# Patient Record
Sex: Female | Born: 1985 | Hispanic: Yes | Smoking: Never smoker
Health system: Southern US, Community
[De-identification: ages and names within clinical notes are randomized; demographics above are authoritative.]

## PROBLEM LIST (undated history)

## (undated) DIAGNOSIS — E119 Type 2 diabetes mellitus without complications: Secondary | ICD-10-CM

## (undated) DIAGNOSIS — Z789 Other specified health status: Secondary | ICD-10-CM

## (undated) HISTORY — DX: Other specified health status: Z78.9

---

## 2013-06-03 NOTE — L&D Delivery Note (Signed)
Attestation of Attending Supervision of Advanced Practitioner (CNM/NP): Evaluation and management procedures were performed by the Advanced Practitioner under my supervision and collaboration.  I have reviewed the Advanced Practitioner's note and chart, and I agree with the management and plan.  Mattix Imhof 03/06/2014 9:20 AM

## 2013-06-03 NOTE — L&D Delivery Note (Signed)
Delivery Note At 6:45 PM a viable and healthy female was delivered via  (Presentation: LOA).  APGAR: 8 & 9;  weight 7 lb 12 oz .   Placenta status: intact Anesthesia:  Epidural Episiotomy: None Lacerations: None Est. Blood Loss (mL): 400  Mom to postpartum.  Baby to Couplet care / Skin to Skin.  Rochele PagesKARIM, Cha Gomillion N 03/01/2014, 7:00 PM

## 2013-10-20 ENCOUNTER — Ambulatory Visit (INDEPENDENT_AMBULATORY_CARE_PROVIDER_SITE_OTHER): Payer: Self-pay | Admitting: *Deleted

## 2013-10-20 ENCOUNTER — Encounter: Payer: Self-pay | Admitting: *Deleted

## 2013-10-20 VITALS — BP 104/70 | HR 86 | Temp 97.4°F

## 2013-10-20 DIAGNOSIS — Z3201 Encounter for pregnancy test, result positive: Secondary | ICD-10-CM

## 2013-10-20 LAB — POCT PREGNANCY, URINE: PREG TEST UR: POSITIVE — AB

## 2013-10-21 LAB — OBSTETRIC PANEL
Antibody Screen: NEGATIVE
Basophils Absolute: 0 10*3/uL (ref 0.0–0.1)
Basophils Relative: 0 % (ref 0–1)
EOS ABS: 0.5 10*3/uL (ref 0.0–0.7)
Eosinophils Relative: 7 % — ABNORMAL HIGH (ref 0–5)
HCT: 34.5 % — ABNORMAL LOW (ref 36.0–46.0)
HEMOGLOBIN: 11.8 g/dL — AB (ref 12.0–15.0)
Hepatitis B Surface Ag: NEGATIVE
LYMPHS PCT: 25 % (ref 12–46)
Lymphs Abs: 1.8 10*3/uL (ref 0.7–4.0)
MCH: 28.9 pg (ref 26.0–34.0)
MCHC: 34.2 g/dL (ref 30.0–36.0)
MCV: 84.6 fL (ref 78.0–100.0)
Monocytes Absolute: 0.5 10*3/uL (ref 0.1–1.0)
Monocytes Relative: 7 % (ref 3–12)
NEUTROS PCT: 61 % (ref 43–77)
Neutro Abs: 4.5 10*3/uL (ref 1.7–7.7)
PLATELETS: 228 10*3/uL (ref 150–400)
RBC: 4.08 MIL/uL (ref 3.87–5.11)
RDW: 13.5 % (ref 11.5–15.5)
Rh Type: POSITIVE
Rubella: 1.35 Index — ABNORMAL HIGH (ref <0.90)
WBC: 7.3 10*3/uL (ref 4.0–10.5)

## 2013-10-21 LAB — HIV ANTIBODY (ROUTINE TESTING W REFLEX): HIV 1&2 Ab, 4th Generation: NONREACTIVE

## 2013-10-22 LAB — HEMOGLOBINOPATHY EVALUATION
HGB A: 97.2 % (ref 96.8–97.8)
HGB S QUANTITAION: 0 %
Hemoglobin Other: 0 %
Hgb A2 Quant: 2.8 % (ref 2.2–3.2)
Hgb F Quant: 0 % (ref 0.0–2.0)

## 2013-10-29 ENCOUNTER — Other Ambulatory Visit: Payer: Self-pay | Admitting: Obstetrics & Gynecology

## 2013-10-29 ENCOUNTER — Ambulatory Visit (HOSPITAL_COMMUNITY)
Admission: RE | Admit: 2013-10-29 | Discharge: 2013-10-29 | Disposition: A | Discharge status: Home / Self Care | Payer: Medicaid Other | Source: Ambulatory Visit | Attending: Obstetrics & Gynecology | Admitting: Obstetrics & Gynecology

## 2013-10-29 DIAGNOSIS — Z3201 Encounter for pregnancy test, result positive: Secondary | ICD-10-CM

## 2013-10-29 DIAGNOSIS — Z3689 Encounter for other specified antenatal screening: Secondary | ICD-10-CM | POA: Insufficient documentation

## 2013-11-18 ENCOUNTER — Encounter: Payer: Self-pay | Admitting: Family Medicine

## 2013-11-18 ENCOUNTER — Ambulatory Visit (INDEPENDENT_AMBULATORY_CARE_PROVIDER_SITE_OTHER): Payer: Self-pay | Admitting: Family Medicine

## 2013-11-18 VITALS — BP 111/79 | HR 105 | Temp 98.1°F | Ht 60.0 in | Wt 190.0 lb

## 2013-11-18 DIAGNOSIS — Z23 Encounter for immunization: Secondary | ICD-10-CM

## 2013-11-18 DIAGNOSIS — O34219 Maternal care for unspecified type scar from previous cesarean delivery: Secondary | ICD-10-CM

## 2013-11-18 DIAGNOSIS — Z3480 Encounter for supervision of other normal pregnancy, unspecified trimester: Secondary | ICD-10-CM

## 2013-11-18 DIAGNOSIS — Z98891 History of uterine scar from previous surgery: Secondary | ICD-10-CM | POA: Insufficient documentation

## 2013-11-18 LAB — POCT URINALYSIS DIP (DEVICE)
BILIRUBIN URINE: NEGATIVE
Glucose, UA: 100 mg/dL — AB
HGB URINE DIPSTICK: NEGATIVE
Ketones, ur: NEGATIVE mg/dL
Nitrite: NEGATIVE
Protein, ur: NEGATIVE mg/dL
Specific Gravity, Urine: 1.025 (ref 1.005–1.030)
UROBILINOGEN UA: 0.2 mg/dL (ref 0.0–1.0)
pH: 6 (ref 5.0–8.0)

## 2013-11-18 LAB — CBC
HEMATOCRIT: 35.1 % — AB (ref 36.0–46.0)
Hemoglobin: 11.9 g/dL — ABNORMAL LOW (ref 12.0–15.0)
MCH: 28.9 pg (ref 26.0–34.0)
MCHC: 33.9 g/dL (ref 30.0–36.0)
MCV: 85.2 fL (ref 78.0–100.0)
PLATELETS: 230 10*3/uL (ref 150–400)
RBC: 4.12 MIL/uL (ref 3.87–5.11)
RDW: 13.2 % (ref 11.5–15.5)
WBC: 7.7 10*3/uL (ref 4.0–10.5)

## 2013-11-18 MED ORDER — TETANUS-DIPHTH-ACELL PERTUSSIS 5-2.5-18.5 LF-MCG/0.5 IM SUSP
0.5000 mL | Freq: Once | INTRAMUSCULAR | Status: AC
  Administered 2013-11-18: 0.5 mL via INTRAMUSCULAR

## 2013-11-18 MED ORDER — FLUCONAZOLE 150 MG PO TABS: 150.0000 mg | ORAL_TABLET | Freq: Once | ORAL | Status: DC

## 2013-11-18 NOTE — Progress Notes (Signed)
   Subjective:    Regina [name] is a [id number] [id number] being seen today for her first obstetrical visit.  Her obstetrical history is significant for late to prenatal care. Pregnancy history fully reviewed.  Patient reports nausea, vaginal irritation, vomiting and vaginal discharge.  Filed Vitals:   11/18/13 0858 11/18/13 0903  BP: 111/79   Pulse: 105   Temp: 98.1 F (36.7 C)   Height:  5' (1.524 m)  Weight: 190 lb (86.183 kg)     HISTORY: OB History  Gravida Para Term Preterm AB SAB TAB Ectopic Multiple Living  3 2 2  0 0 0 0 0 0 2    # Outcome Date GA Lbr Len/2nd Weight Sex Delivery Anes PTL Lv  3 CUR           2 TRM 03/17/10 2117w0d  6 lb (2.722 kg) F VBAC     1 TRM 06/10/07 7517w0d  7 lb (3.175 kg) M CS        Comments: No complications - umbilical cord around the neck     Past Medical History  Diagnosis Date  . Medical history non-contributory    Past Surgical History  Procedure Laterality Date  . Cesarean section     Family History  Problem Relation Age of Onset  . Heart disease Mother   . Hypertension Mother   . Diabetes Mother   . Hyperlipidemia Mother      Exam    Uterus:   25 cm  Pelvic Exam:    Perineum: Normal Perineum   Vulva: Bartholin's, Urethra, Skene's normal   Vagina:  normal mucosa, normal discharge   Cervix: multiparous appearance   Adnexa: normal adnexa   Bony Pelvis: average  System: Breast:  normal appearance, no masses or tenderness   Skin: normal coloration and turgor, no rashes    Neurologic: normal   Extremities: normal strength, tone, and muscle mass   HEENT sclera clear, anicteric   Mouth/Teeth mucous membranes moist, pharynx normal without lesions   Neck supple   Cardiovascular: regular rate and rhythm, no murmurs or gallops   Respiratory:  appears well, vitals normal, no respiratory distress, acyanotic, normal RR, ear and throat exam is normal, neck free of mass or lymphadenopathy, chest clear, no wheezing,  crepitations, rhonchi, normal symmetric air entry   Abdomen: soft, non-tender; bowel sounds normal; no masses,  no organomegaly      Assessment:    Pregnancy: [id number] Patient Active Problem List   Diagnosis Date Noted  . Supervision of other normal pregnancy 11/18/2013        Plan:     Initial labs reviewed. Prenatal vitamins. Problem list reviewed and updated. Genetic Screening discussed Quad Screen: too late.  Ultrasound discussed; fetal survey: results reviewed.  Follow up in 2 weeks. 28 wk labs today and TDaP Pap and wet prep--appears to be years--rx sent to pharmacy  PRATT,TANYA S 11/18/2013

## 2013-11-18 NOTE — Patient Instructions (Addendum)
Tercer trimestre de embarazo (Third Trimester of Pregnancy) El tercer trimestre va desde la semana29 hasta la 42, desde el sptimo hasta el noveno mes, y es la poca en la que el feto crece ms rpidamente. Hacia el final del noveno mes, el feto mide alrededor de 20pulgadas (45cm) de largo y pesa entre 6 y 10 libras (2,700 y 4,500kg).  CAMBIOS EN EL ORGANISMO Su organismo atraviesa por muchos cambios durante el embarazo, y estos varan de una mujer a otra.   Seguir aumentando de peso. Es de esperar que aumente entre 25 y 35libras (11 y 16kg) hacia el final del embarazo.  Podrn aparecer las primeras estras en las caderas, el abdomen y las mamas.  Puede tener necesidad de orinar con ms frecuencia porque el feto baja hacia la pelvis y ejerce presin sobre la vejiga.  Debido al embarazo podr sentir acidez estomacal con frecuencia.  Puede estar estreida, ya que ciertas hormonas enlentecen los movimientos de los msculos que empujan los desechos a travs de los intestinos.  Pueden aparecer hemorroides o abultarse e hincharse las venas (venas varicosas).  Puede sentir dolor plvico debido al aumento de peso y a que las hormonas del embarazo relajan las articulaciones entre los huesos de la pelvis. El dolor de espalda puede ser consecuencia de la sobrecarga de los msculos que soportan la postura.  Tal vez haya cambios en el cabello que pueden incluir su engrosamiento, crecimiento rpido y cambios en la textura. Adems, a algunas mujeres se les cae el cabello durante o despus del embarazo, o tienen el cabello seco o fino. Lo ms probable es que el cabello se le normalice despus del nacimiento del beb.  Las mamas seguirn creciendo y le dolern. A veces, puede haber una secrecin amarilla de las mamas llamada calostro.  El ombligo puede salir hacia afuera.  Puede sentir que le falta el aire debido a que se expande el tero.  Puede notar que el feto "baja" o lo siente ms bajo, en  el abdomen.  Puede tener una prdida de secrecin mucosa con sangre. Esto suele ocurrir en el trmino de unos pocos das a una semana antes de que comience el trabajo de parto.  El cuello del tero se vuelve delgado y blando (se borra) cerca de la fecha de parto. QU DEBE ESPERAR EN LOS EXMENES PRENATALES  Le harn exmenes prenatales cada 2semanas hasta la semana36. A partir de ese momento le harn exmenes semanales. Durante una visita prenatal de rutina:  La pesarn para asegurarse de que usted y el feto estn creciendo normalmente.  Le tomarn la presin arterial.  Le medirn el abdomen para controlar el desarrollo del beb.  Se escucharn los latidos cardacos fetales.  Se evaluarn los resultados de los estudios solicitados en visitas anteriores.  Le revisarn el cuello del tero cuando est prxima la fecha de parto para controlar si este se ha borrado. Alrededor de la semana36, el mdico le revisar el cuello del tero. Al mismo tiempo, realizar un anlisis de las secreciones del tejido vaginal. Este examen es para determinar si hay un tipo de bacteria, estreptococo Grupo B. El mdico le explicar esto con ms detalle. El mdico puede preguntarle lo siguiente:  Cmo le gustara que fuera el parto.  Cmo se siente.  Si siente los movimientos del beb.  Si ha tenido sntomas anormales, como prdida de lquido, sangrado, dolores de cabeza intensos o clicos abdominales.  Si tiene alguna pregunta. Otros exmenes o estudios de deteccin que pueden realizarse   durante el tercer trimestre incluyen lo siguiente:  Anlisis de sangre para controlar las concentraciones de hierro (anemia).  Controles fetales para determinar su salud, nivel de actividad y crecimiento. Si tiene alguna enfermedad o hay problemas durante el embarazo, le harn estudios. FALSO TRABAJO DE PARTO Es posible que sienta contracciones leves e irregulares que finalmente desaparecen. Se llaman contracciones de  Braxton Hicks o falso trabajo de parto. Las contracciones pueden durar horas, das o incluso semanas, antes de que el verdadero trabajo de parto se inicie. Si las contracciones ocurren a intervalos regulares, se intensifican o se hacen dolorosas, lo mejor es que la revise el mdico.  SIGNOS DE TRABAJO DE PARTO   Clicos de tipo menstrual.  Contracciones cada 5minutos o menos.  Contracciones que comienzan en la parte superior del tero y se extienden hacia abajo, a la zona inferior del abdomen y la espalda.  Sensacin de mayor presin en la pelvis o dolor de espalda.  Una secrecin de mucosidad acuosa o con sangre que sale de la vagina. Si tiene alguno de estos signos antes de la semana37 del embarazo, llame a su mdico de inmediato. Debe concurrir al hospital para que la controlen inmediatamente. INSTRUCCIONES PARA EL CUIDADO EN EL HOGAR   Evite fumar, consumir hierbas, beber alcohol y tomar frmacos que no le hayan recetado. Estas sustancias qumicas afectan la formacin y el desarrollo del beb.  Siga las indicaciones del mdico en relacin con el uso de medicamentos. Durante el embarazo, hay medicamentos que son seguros de tomar y otros que no.  Haga actividad fsica solo en la forma indicada por el mdico. Sentir clicos uterinos es un buen signo para detener la actividad fsica.  Contine comiendo alimentos que sanos con regularidad.  Use un sostn que le brinde buen soporte si le duelen las mamas.  No se d baos de inmersin en agua caliente, baos turcos ni saunas.  Colquese el cinturn de seguridad cuando conduzca.  No coma carne cruda ni queso sin cocinar; evite el contacto con las bandejas sanitarias de los gatos y la tierra que estos animales usan. Estos elementos contienen grmenes que pueden causar defectos congnitos en el beb.  Tome las vitaminas prenatales.  Si est estreida, pruebe un laxante suave (si el mdico lo autoriza). Consuma ms alimentos ricos en  fibra, como vegetales y frutas frescos y cereales integrales. Beba gran cantidad de lquido para mantener la orina de tono claro o color amarillo plido.  Dese baos de asiento con agua tibia para aliviar el dolor o las molestias causadas por las hemorroides. Use una crema para las hemorroides si el mdico la autoriza.  Si tiene venas varicosas, use medias de descanso. Eleve los pies durante 15minutos, 3 o 4veces por da. Limite la cantidad de sal en su dieta.  Evite levantar objetos pesados, use zapatos de tacones bajos y mantenga una buena postura.  Descanse con las piernas elevadas si tiene calambres o dolor de cintura.  Visite a su dentista si no lo ha hecho durante el embarazo. Use un cepillo de dientes blando para higienizarse los dientes y psese el hilo dental con suavidad.  Puede seguir manteniendo relaciones sexuales, a menos que el mdico le indique lo contrario.  No haga viajes largos excepto que sea absolutamente necesario y solo con la autorizacin del mdico.  Tome clases prenatales para entender, practicar y hacer preguntas sobre el trabajo de parto y el parto.  Haga un ensayo de la partida al hospital.  Prepare el bolso que   llevar al hospital.  Prepare la habitacin del beb.  Concurra a todas las visitas prenatales segn las indicaciones de su mdico. SOLICITE ATENCIN MDICA SI:  No est segura de que est en trabajo de parto o de que ha roto la bolsa de las aguas.  Tiene mareos.  Siente clicos leves, presin en la pelvis o dolor persistente en el abdomen.  Tiene nuseas, vmitos o diarrea persistentes.  Tiene secrecin vaginal con mal olor.  Siente dolor al [employer]. SOLICITE ATENCIN MDICA DE INMEDIATO SI:   Tiene fiebre.  Tiene una prdida de lquido por la vagina.  Tiene sangrado o pequeas prdidas vaginales.  Siente dolor intenso o clicos en el abdomen.  Sube o baja de peso rpidamente.  Tiene dificultad para respirar y siente dolor de  pecho.  Sbitamente se le hinchan mucho el rostro, las [place], los tobillos, los pies o las piernas.  No ha sentido los movimientos del beb durante [place].  Siente un dolor de cabeza intenso que no se alivia con medicamentos.  Hay cambios en la visin. Document Released: 02/27/2005 Document Revised: 05/25/2013 [facility] Patient Information 2015 [place]. This information is not intended to replace advice given to you by your health care provider. Make sure you discuss any questions you have with your health care provider.  Lactancia materna (Breastfeeding) Decidir Economist es una de las mejores elecciones que puede hacer por usted y su beb. El cambio hormonal durante el Media planner produce el desarrollo del tejido mamario y [place] la cantidad y el tamao de los conductos galactforos. Estas hormonas tambin permiten que las protenas, los azcares y las grasas de la sangre produzcan la [employer] materna en las glndulas productoras de [place]. Las hormonas impiden que la leche materna sea liberada antes del nacimiento del beb, adems de impulsar el flujo de leche luego del nacimiento. Una vez que ha comenzado a Economist, Freight forwarder beb, as Therapist, occupational succin o Social research officer, government, pueden estimular la liberacin de [place] de las glndulas productoras de [place].  LOS BENEFICIOS DE AMAMANTAR Para el beb  La primera leche (calostro) ayuda a Garment/textile technologist funcionamiento del sistema digestivo del beb.  La leche tiene anticuerpos que ayudan a Chemical engineer las infecciones en el beb.  El beb tiene una menor incidencia de asma, alergias y del sndrome de muerte sbita del lactante.  Los nutrientes en la [place] materna son mejores para el beb que la [place] maternizada y estn preparados exclusivamente para cubrir las necesidades del beb.  La leche materna mejora el desarrollo cerebral del beb.  Es menos probable que el beb desarrolle otras enfermedades, como obesidad infantil, asma o diabetes mellitus de  tipo 2. Para usted   La lactancia materna favorece el desarrollo de un vnculo muy especial entre la madre y el beb.  Es conveniente. La leche materna siempre est disponible a la Tree surgeon y es [place].  La lactancia materna ayuda a quemar caloras y a perder el peso ganado durante el [place].  Favorece la contraccin del tero al tamao que tena antes del embarazo de manera ms rpida y disminuye el sangrado (loquios) despus del parto.  La lactancia materna contribuye a reducir Catering manager de desarrollar diabetes mellitus de tipo 2, osteoporosis o cncer de mama o de ovario en el futuro. SIGNOS DE QUE EL BEB EST HAMBRIENTO Primeros signos de hambre  Aumenta su estado de [place].  Se estira.  Mueve la cabeza de un lado a otro.  Mueve la cabeza y  abre la boca cuando se le toca la mejilla o la comisura de la boca (reflejo de bsqueda).  Aumenta las vocalizaciones, tales como sonidos de succin, se relame los labios, emite arrullos, suspiros, o chirridos.  Mueve la mano hacia la boca.  Se chupa con ganas los dedos o las manos. Signos tardos de hambre  Est agitado.  Llora de manera intermitente. Signos de hambre extrema Los signos de hambre extrema requerirn que lo calme y lo consuele antes de que el beb pueda alimentarse adecuadamente. No espere a que se manifiesten los siguientes signos de hambre extrema para comenzar a amamantar:   Agitacin.  Llanto intenso y fuerte.   Gritos. INFORMACIN BSICA SOBRE LA LACTANCIA MATERNA Iniciacin de la lactancia materna  Encuentre un lugar cmodo para sentarse o acostarse, con un buen respaldo para el cuello y la espalda.  Coloque una almohada o una manta enrollada debajo del beb para acomodarlo a la altura de la mama (si est sentada). Las almohadas para amamantar se han diseado especialmente a fin de servir de apoyo para los brazos y el beb mientras amamanta.  Asegrese de que el abdomen del  beb est frente al suyo.  Masajee suavemente la mama. Con las yemas de los dedos, masajee la pared del pecho hacia el pezn en un movimiento circular. Esto estimula el flujo de leche. Es posible que deba continuar este movimiento mientras amamanta si la leche fluye lentamente.  Sostenga la mama con el pulgar por arriba del pezn y los otros 4 dedos por debajo de la mama. Asegrese de que los dedos se encuentren lejos del pezn y de la boca del beb.  Empuje suavemente los labios del beb con el pezn o con el dedo.  Cuando la boca del beb se abra lo suficiente, acrquelo rpidamente a la mama e introduzca todo el pezn y la zona oscura que lo rodea (areola), tanto como sea posible, dentro de la boca del beb.  Debe haber ms areola visible por arriba del labio superior del beb que por debajo del labio inferior.  La lengua del beb debe estar entre la enca inferior y la mama.  Asegrese de que la boca del beb est en la posicin correcta alrededor del pezn (prendida). Los labios del beb deben crear un sello sobre la mama y estar doblados hacia afuera (invertidos).  Es comn que el beb succione durante 2 a 3 minutos para que comience el flujo de leche materna. Cmo debe prenderse Es muy importante que le ensee al beb cmo prenderse adecuadamente a la mama. Si el beb no se prende adecuadamente, puede causarle dolor en el pezn y reducir la produccin de leche materna, y hacer que el beb tenga un escaso aumento de peso. Adems, si el beb no se prende adecuadamente al pezn, puede tragar aire durante la alimentacin. Esto puede causarle molestias al beb. Hacer eructar al beb al cambiar de mama puede ayudarlo a liberar el aire. Sin embargo, ensearle al beb cmo prenderse a la mama adecuadamente es la mejor manera de evitar que se sienta molesto por tragar aire mientras se alimenta. Signos de que el beb se ha prendido adecuadamente al pezn:   Tironea o succiona de modo silencioso,  sin causarle dolor.  Se escucha que traga cada 3 o 4 succiones.   Hay movimientos musculares por arriba y por delante de sus odos al succionar. Signos de que el beb no se ha prendido adecuadamente al pezn:   Hace ruidos de succin o   de chasquido mientras se alimenta.  Siente dolor en el pezn. Si cree que el beb no se prendi correctamente, deslice el dedo en la comisura de la boca y colquelo entre las encas del beb para interrumpir la succin. Intente comenzar a amamantar nuevamente. Signos de lactancia materna exitosa Signos del beb:   Disminuye gradualmente el nmero de succiones o cesa la succin por completo.  Se duerme.  Relaja el cuerpo.  Retiene una pequea cantidad de leche en la boca.  Se desprende solo del pecho. Signos que presenta usted:  Las mamas han aumentado la firmeza, el peso y el tamao 1 a 3 horas despus de amamantar.  Estn ms blandas inmediatamente despus de amamantar.  Un aumento del volumen de leche, y tambin un cambio en su consistencia y color se producen hacia el quinto da de lactancia materna.  Los pezones no duelen, ni estn agrietados ni sangran. Signos de que su beb recibe la cantidad de leche suficiente  Moja al menos 3 paales en 24 horas. La orina debe ser clara y de color amarillo plido a los 5 das de vida.  Defeca al menos 3 veces en 24 horas a los 5 das de vida. La materia fecal debe ser blanda y amarillenta.  Defeca al menos 3 veces en 24 horas a los 7 das de vida. La materia fecal debe ser grumosa y amarillenta.  No registra una prdida de peso mayor del 10% del peso al nacer durante los primeros 3 das de vida.  Aumenta de peso un promedio de 4 a 7onzas (113 a 198g) por semana despus de los 4 das de vida.  Aumenta de peso, diariamente, de manera uniforme a partir de los 5 das de vida, sin registrar prdida de peso despus de las 2semanas de vida. Despus de alimentarse, es posible que el beb regurgite una  pequea cantidad. Esto es frecuente. FRECUENCIA Y DURACIN DE LA LACTANCIA MATERNA El amamantamiento frecuente la ayudar a producir ms leche y a prevenir problemas de dolor en los pezones e hinchazn en las mamas. Alimente al beb cuando muestre signos de hambre o si siente la necesidad de reducir la congestin de las mamas. Esto se denomina "lactancia a demanda". Evite el uso del chupete mientras trabaja para establecer la lactancia (las primeras 4 a 6 semanas despus del nacimiento del beb). Despus de este perodo, podr ofrecerle un chupete. Las investigaciones demostraron que el uso del chupete durante el primer ao de vida del beb disminuye el riesgo de desarrollar el sndrome de muerte sbita del lactante (SMSL). Permita que el nio se alimente en cada mama todo lo que desee. Contine amamantando al beb hasta que haya terminado de alimentarse. Cuando el beb se desprende o se queda dormido mientras se est alimentando de la primera mama, ofrzcale la segunda. Debido a que, con frecuencia, los recin nacidos permanecen somnolientos las primeras semanas de vida, es posible que deba despertar al beb para alimentarlo. Los horarios de lactancia varan de un beb a otro. Sin embargo, las siguientes reglas pueden servir como gua para ayudarla a garantizar que el beb se alimenta adecuadamente:  Se puede amamantar a los recin nacidos (bebs de 4 semanas o menos de vida) cada 1 a 3 horas.  No deben transcurrir ms de 3 horas durante el da o 5 horas durante la noche sin que se amamante a los recin nacidos.  Debe amamantar al beb 8 veces como mnimo en un perodo de 24 horas, hasta que comience a   introducir slidos en su dieta, a los 6 meses de vida aproximadamente. EXTRACCIN DE LECHE MATERNA La extraccin y el almacenamiento de la leche materna le permiten asegurarse de que el beb se alimente exclusivamente de leche materna, aun en momentos en los que no puede amamantar. Esto tiene especial  importancia si debe regresar al trabajo en el perodo en que an est amamantando o si no puede estar presente en los momentos en que el beb debe alimentarse. Su asesor en lactancia puede orientarla sobre cunto tiempo es seguro almacenar leche materna.  El sacaleche es un aparato que le permite extraer leche de la mama a un recipiente estril. Luego, la leche materna extrada puede almacenarse en un refrigerador o congelador. Algunos sacaleches son manuales, mientras que otros son elctricos. Consulte a su asesor en lactancia qu tipo ser ms conveniente para usted. Los sacaleches se pueden comprar; sin embargo, algunos hospitales y grupos de apoyo a la lactancia materna alquilan sacaleches mensualmente. Un asesor en lactancia puede ensearle cmo extraer leche materna manualmente, en caso de que prefiera no usar un sacaleche.  CMO CUIDAR LAS MAMAS DURANTE LA LACTANCIA MATERNA Los pezones se secan, agrietan y duelen durante la lactancia materna. Las siguientes recomendaciones pueden ayudarla a mantener las mamas humectadas y sanas:  Evite usar jabn en los pezones.  Use un sostn de soporte. Aunque no son esenciales, las camisetas sin mangas o los sostenes especiales para amamantar estn diseados para acceder fcilmente a las mamas, para amamantar sin tener que quitarse todo el sostn o la camiseta. Evite usar sostenes con aro o sostenes muy ajustados.  Seque al aire sus pezones durante 3 a 4minutos despus de amamantar al beb.  Utilice solo apsitos de algodn en el sostn para absorber las prdidas de leche. La prdida de un poco de leche materna entre las tomas es normal.  Utilice lanolina sobre los pezones luego de amamantar. La lanolina ayuda a mantener la humedad normal de la piel. Si usa lanolina pura, no tiene que lavarse los pezones antes de volver a alimentar al beb. La lanolina pura no es txica para el beb. Adems, puede extraer manualmente algunas gotas de leche materna y masajear  suavemente esa leche sobre los pezones, para que la leche se seque al aire. Durante las primeras semanas despus de dar a luz, algunas mujeres pueden experimentar hinchazn en las mamas (congestin mamaria). La congestin puede hacer que sienta las mamas pesadas, calientes y sensibles al tacto. El pico de la congestin ocurre dentro de los 3 a 5 das despus del parto. Las siguientes recomendaciones pueden ayudarla a aliviar la congestin:  Vace por completo las mamas al amamantar o extraer leche. Puede aplicar calor hmedo en las mamas (en la ducha o con toallas hmedas para manos) antes de amamantar o extraer leche. Esto aumenta la circulacin y ayuda a que la leche fluya. Si el beb no vaca por completo las mamas cuando lo amamanta, extraiga la leche restante despus de que haya finalizado.  Use un sostn ajustado (para amamantar o comn) o una camiseta sin mangas durante 1 o 2 das para indicar al cuerpo que disminuya ligeramente la produccin de leche.  Aplique compresas de hielo sobre las mamas, a menos que le resulte demasiado incmodo.  Asegrese de que el beb est prendido y se encuentre en la posicin correcta mientras lo alimenta. Si la congestin persiste luego de 48 horas o despus de seguir estas recomendaciones, comunquese con su mdico o un asesor en lactancia. RECOMENDACIONES GENERALES   PARA EL CUIDADO DE LA SALUD DURANTE LA LACTANCIA MATERNA  Consuma alimentos saludables. Alterne comidas y colaciones, y coma 3 de cada una por da. Dado que lo que come Danaher Corporationafecta la leche materna, es posible que algunas comidas hagan que su beb se vuelva ms irritable de lo habitual. Evite comer este tipo de alimentos si percibe que afectan de manera negativa al beb.  Beba leche, jugos de fruta y agua para Patent examinersatisfacer su sed (aproximadamente 10 vasos al Futures traderda).  Descanse con frecuencia, reljese y tome sus vitaminas prenatales para evitar la fatiga, el estrs y la anemia.  Contine con los  autocontroles de la mama.  Evite masticar y fumar tabaco.  Evite el consumo de alcohol y drogas. Algunos medicamentos, que pueden ser perjudiciales para el beb, pueden pasar a travs de la Colgate Palmoliveleche materna. Es importante que consulte a su mdico antes de Medical sales representativetomar cualquier medicamento, incluidos todos los medicamentos recetados y de Ponderosa Pineventa libre, as como los suplementos vitamnicos y herbales. Puede quedar embarazada durante la lactancia. Si desea controlar la natalidad, consulte a su mdico cules son las opciones ms seguras para el beb. SOLICITE ATENCIN MDICA SI:   Usted siente que quiere dejar de Museum/gallery exhibitions officeramamantar o se siente frustrada con la lactancia.  Siente dolor en las mamas o en los pezones.  Sus pezones estn agrietados o Water quality scientistsangran.  Sus pechos estn irritados, sensibles o calientes.  Tiene un rea hinchada en cualquiera de las mamas.  Siente escalofros o fiebre.  Tiene nuseas o vmitos.  Presenta una secrecin de otro lquido distinto de la leche materna de los pezones.  Sus mamas no se llenan antes de Museum/gallery exhibitions officeramamantar al beb para el quinto da despus del Crystal Fallsparto.  Se siente triste y deprimida.  El beb est demasiado somnoliento como para comer bien.  El beb tiene problemas para dormir.  Moja menos de 3 paales en 24 horas.  Defeca menos de 3 veces en 24 horas.  La piel del beb o la parte blanca de los ojos se vuelven amarillentas.  El beb no ha aumentado de Bethel Springspeso a los [address] das de [place]ida. SOLICITE ATENCIN MDICA DE INMEDIATO SI:   El beb est muy cansado Retail buyer(letargo) y no se quiere despertar para comer.  Le sube la fiebre sin causa. Document Released: 05/20/2005 Document Revised: 05/25/2013 [facility] Patient Information 2015 [place] NeckExitCare, MarylandLLC. This information is not intended to replace advice given to you by your health care provider. Make sure you discuss any questions you have with your health care provider. Parto vaginal despus de Eustace Quailuna cesrea (Vaginal Birth After Cesarean  Delivery) Un parto vaginal despus de un parto por cesrea es dar a luz por la vagina despus de haber dado a luz por medio de una intervencin Barbadosquirrgica. En el pasado, si una mujer tena un beb por cesrea, todos los partos posteriores deban hacerse por cesrea. Esto ya no es as. Puede ser seguro para la mam intentar un parto vaginal luego de una cesrea.  Es importante que converse con su mdico desde comienzos del Psychiatristembarazo de modo que pueda Googlecomprender los riesgos, beneficios y opciones. Le dar tiempo para decidir qu es lo mejor en su caso particular. La decisin final de tener un parto vaginal o por cesrea debe tomarse en conjunto, entre usted y Film/video editorel mdico. ComptrollerCualquier cambio en su salud o la de su beb durante el embarazo puede ser motivo de un cambio de decisin respecto del parto vaginal.  LAS MUJERES QUE OPTAN POR EL PARTO VAGINAL, DEBEN CONSULTAR  AL MDICO PARA ASEGURARSE DE QUE:  La cesrea anterior se haya realizado con un corte (incisin) uterino transversal (no con una incisin vertical clsica).  El canal de parto es lo suficientemente grande como para que pase el [place].  No ha sido sometida a otras operaciones del tero.  Durante el trabajo de parto, le realizarn un monitoreo fetal Forensic scientistelectrnico, en todo momento.  Habr un quirfano disponible y listo en caso de necesitar una cesrea de emergencia.  Un mdico y personal de quirfano estarn disponibles en todo momento durante el [place] de parto, para realizar una cesrea en caso de ser necesario.  Habr un anestesista disponible en caso de necesitar una cesrea de emergencia.  La nursery est lista y cuenta con personal especializado y el equipo disponible para cuidar al beb en caso de emergencia. BENEFICIOS DEL PARTO VAGINAL:  Permanencia ms breve en el hospital.  Prevencin de los riesgos asociados con el parto por cesrea, por ejemplo:  Complicaciones quirrgicas, como apertura o hernia de la incisin.  Lesiones en  otros rganos.  Grant RutsFiebre. Esto puede ocurrir si aparece una infeccin despus de la ciruga. Tambin puede ocurrir como reaccin a los medicamentos administrados para adormecerla durante la [place]uga.  Menos prdida de sangre y menos probabilidad de necesitar una transfusin sangunea.  Menor riesgo de cogulos sanguneos e infeccin.  Tiempo ms corto de recuperacin.  Menor riesgo de remocin del tero (histerectoma).  Menor riesgo de que la placenta cubra parcial o completamente la abertura del tero (placenta previa) en embarazos futuros.  Menos riesgos en el [place] Citytrabajo de parto y [place]ael parto futuros. RIESGOS  Ruptura del tero. Esto ocurre en menos del 1% de los partos vaginales. El riesgo de que eso suceda es mayor si:  Se toman medidas para iniciar el proceso del [place] Heightstrabajo de parto (inducir Engineer, manufacturing systemsel parto) o Risk managerestimular o intensificar las contracciones (aumentar el trabajo de [place]parto).  Se usan medicamentos para ablandar (madurar) el cuello del tero.  Es necesario extraer el tero (histerectoma) si se rompe. No debe llevarse a cabo si:  La cesrea previa se realiz con una incisin vertical (clsica) o con forma de T, o usted no sabe cul de Lucent Technologiesellas le han practicado.  Ha sufrido ruptura del tero.  Ha tenido ciertos tipos de Leisure centre managerciruga en el tero, como la extirpacin de fibromas uterinos. Pregntele a su mdico sobre otros tipos de cirugas que le impiden tener un parto vaginal.  Tiene ciertos problemas mdicos o relacionados con el parto (obsttricos).  El beb est en problemas.  Tuvo dos cesreas previas y ningn parto vaginal. OTRAS COSAS QUE DEBE SABER:  La anestesia peridural es segura.  Es seguro dar vuelta al beb si se encuentra de nalgas (intentar una versin ceflica externa).  Es seguro intentarlo en caso de mellizos.  El parto vaginal puede no ser apropiado si el beb pesa 8,8lb (4kg) o ms. Sin embargo, las predicciones de Mooresboropeso no son siempre exactas y no deben ser lo  nico a tenerse en cuenta para decidir si el parto vaginal es lo indicado para usted.  Hay aumento en el porcentaje de fracasos si el intervalo entre la cesrea y el parto vaginal es de menos de 19 meses.  Su mdico puede aconsejarle no tener un parto vaginal si tiene preeclampsia (hipertensin, protena en la orina e hinchazn en la cara y las extremidades).  El parto vaginal suele ser exitoso si ya tuvo un parto vaginal previamente.  Tambin suele ser exitoso cuando el [place]abajo de [place]parto  comienza espontneamente antes de la fecha.  El parto vaginal despus de Eustace Quailuna cesrea es similar a un parto espontneo vaginal normal. Document Released: 11/06/2007 Document Revised: 03/10/2013 ExitCare Patient Information 2015 [employer] [place]. This information is not intended to replace advice given to you by your health care provider. Make sure you discuss any questions you have with your health care provider.

## 2013-11-18 NOTE — Progress Notes (Signed)
Reports watery discharge causing itching and irritation. Wet prep and pap today.  Reports occasional lower abdominal/pelvic pain/pressure.  Discussed appropriate weight gain based on BMI (11-20lb); patient verbalized understanding.  New OB packet given.  1hr gtt and labs today. Tdap today.

## 2013-11-19 ENCOUNTER — Encounter: Payer: Self-pay | Admitting: Family Medicine

## 2013-11-19 LAB — WET PREP, GENITAL
Clue Cells Wet Prep HPF POC: NONE SEEN
Trich, Wet Prep: NONE SEEN
WBC, Wet Prep HPF POC: NONE SEEN

## 2013-11-19 LAB — RPR

## 2013-11-19 LAB — GLUCOSE TOLERANCE, 1 HOUR (50G) W/O FASTING: GLUCOSE 1 HOUR GTT: 96 mg/dL (ref 70–140)

## 2013-11-19 LAB — HIV ANTIBODY (ROUTINE TESTING W REFLEX): HIV: NONREACTIVE

## 2013-11-20 LAB — PRESCRIPTION MONITORING PROFILE (19 PANEL)
Amphetamine/Meth: NEGATIVE ng/mL
Barbiturate Screen, Urine: NEGATIVE ng/mL
Benzodiazepine Screen, Urine: NEGATIVE ng/mL
Buprenorphine, Urine: NEGATIVE ng/mL
CANNABINOID SCRN UR: NEGATIVE ng/mL
COCAINE METABOLITES: NEGATIVE ng/mL
Carisoprodol, Urine: NEGATIVE ng/mL
Creatinine, Urine: 116.67 mg/dL (ref >20.0)
ECSTASY: NEGATIVE ng/mL
Fentanyl, Ur: NEGATIVE ng/mL
MEPERIDINE UR: NEGATIVE ng/mL
METHAQUALONE SCREEN (URINE): NEGATIVE ng/mL
Methadone Screen, Urine: NEGATIVE ng/mL
Nitrites, Initial: NEGATIVE ug/mL
Opiate Screen, Urine: NEGATIVE ng/mL
Oxycodone Screen, Ur: NEGATIVE ng/mL
PH URINE, INITIAL: 6.4 pH (ref 4.5–8.9)
PHENCYCLIDINE, UR: NEGATIVE ng/mL
Propoxyphene: NEGATIVE ng/mL
Tapentadol, urine: NEGATIVE ng/mL
Tramadol Scrn, Ur: NEGATIVE ng/mL
Zolpidem, Urine: NEGATIVE ng/mL

## 2013-11-20 LAB — CULTURE, OB URINE: Colony Count: 5000

## 2013-11-22 LAB — CYTOLOGY - PAP

## 2013-12-02 ENCOUNTER — Ambulatory Visit (INDEPENDENT_AMBULATORY_CARE_PROVIDER_SITE_OTHER): Payer: Self-pay | Admitting: Obstetrics and Gynecology

## 2013-12-02 ENCOUNTER — Encounter: Payer: Self-pay | Admitting: Obstetrics and Gynecology

## 2013-12-02 VITALS — BP 111/76 | HR 96 | Temp 97.1°F | Wt 194.0 lb

## 2013-12-02 DIAGNOSIS — Z3483 Encounter for supervision of other normal pregnancy, third trimester: Secondary | ICD-10-CM

## 2013-12-02 DIAGNOSIS — O34219 Maternal care for unspecified type scar from previous cesarean delivery: Secondary | ICD-10-CM

## 2013-12-02 DIAGNOSIS — Z3480 Encounter for supervision of other normal pregnancy, unspecified trimester: Secondary | ICD-10-CM

## 2013-12-02 LAB — POCT URINALYSIS DIP (DEVICE)
Bilirubin Urine: NEGATIVE
Glucose, UA: 500 mg/dL — AB
HGB URINE DIPSTICK: NEGATIVE
Ketones, ur: NEGATIVE mg/dL
NITRITE: NEGATIVE
PH: 6 (ref 5.0–8.0)
Protein, ur: NEGATIVE mg/dL
Specific Gravity, Urine: 1.025 (ref 1.005–1.030)
UROBILINOGEN UA: 0.2 mg/dL (ref 0.0–1.0)

## 2013-12-02 MED ORDER — NYSTATIN-TRIAMCINOLONE 100000-0.1 UNIT/GM-% EX OINT: 1.0000 "application " | TOPICAL_OINTMENT | Freq: Two times a day (BID) | CUTANEOUS | Status: DC

## 2013-12-02 NOTE — Progress Notes (Signed)
Patient is doing well. She reports a pruritic rash in groin area. Rash consistent with yeast infection-Mycolog ointment prescribed. FM/PTL precautions reviewed.

## 2013-12-02 NOTE — Progress Notes (Signed)
Edema-feet with pain in leg

## 2013-12-15 ENCOUNTER — Encounter: Payer: Self-pay | Admitting: *Deleted

## 2013-12-17 ENCOUNTER — Ambulatory Visit (INDEPENDENT_AMBULATORY_CARE_PROVIDER_SITE_OTHER): Payer: Self-pay | Admitting: Obstetrics and Gynecology

## 2013-12-17 ENCOUNTER — Encounter: Payer: Self-pay | Admitting: Obstetrics and Gynecology

## 2013-12-17 VITALS — BP 98/69 | HR 80 | Temp 97.1°F | Wt 195.1 lb

## 2013-12-17 DIAGNOSIS — O34219 Maternal care for unspecified type scar from previous cesarean delivery: Secondary | ICD-10-CM

## 2013-12-17 DIAGNOSIS — Z3483 Encounter for supervision of other normal pregnancy, third trimester: Secondary | ICD-10-CM

## 2013-12-17 DIAGNOSIS — Z3480 Encounter for supervision of other normal pregnancy, unspecified trimester: Secondary | ICD-10-CM

## 2013-12-17 LAB — POCT URINALYSIS DIP (DEVICE)
Bilirubin Urine: NEGATIVE
Glucose, UA: NEGATIVE mg/dL
Hgb urine dipstick: NEGATIVE
KETONES UR: NEGATIVE mg/dL
NITRITE: NEGATIVE
PH: 7 (ref 5.0–8.0)
PROTEIN: NEGATIVE mg/dL
Specific Gravity, Urine: 1.02 (ref 1.005–1.030)
Urobilinogen, UA: 0.2 mg/dL (ref 0.0–1.0)

## 2013-12-17 NOTE — Progress Notes (Signed)
Patient reports pressure with breathing sometimes and occasional stomach pain

## 2013-12-17 NOTE — Progress Notes (Signed)
Patient is doing well without complaints. FM/PTL precautions reviewed. Groin rash resolved with cream

## 2013-12-31 ENCOUNTER — Ambulatory Visit (INDEPENDENT_AMBULATORY_CARE_PROVIDER_SITE_OTHER): Payer: Self-pay | Admitting: Obstetrics & Gynecology

## 2013-12-31 VITALS — BP 108/71 | HR 75 | Wt 193.8 lb

## 2013-12-31 DIAGNOSIS — Z3480 Encounter for supervision of other normal pregnancy, unspecified trimester: Secondary | ICD-10-CM

## 2013-12-31 DIAGNOSIS — Z3483 Encounter for supervision of other normal pregnancy, third trimester: Secondary | ICD-10-CM

## 2013-12-31 LAB — POCT URINALYSIS DIP (DEVICE)
Bilirubin Urine: NEGATIVE
Glucose, UA: NEGATIVE mg/dL
Ketones, ur: NEGATIVE mg/dL
Nitrite: NEGATIVE
PROTEIN: 100 mg/dL — AB
SPECIFIC GRAVITY, URINE: 1.025 (ref 1.005–1.030)
UROBILINOGEN UA: 1 mg/dL (ref 0.0–1.0)
pH: 7 (ref 5.0–8.0)

## 2013-12-31 NOTE — Progress Notes (Signed)
States she is eating well and no nausea or heartburn.

## 2013-12-31 NOTE — Progress Notes (Signed)
Feels pain in her lower back and thighs.

## 2013-12-31 NOTE — Patient Instructions (Signed)
Tercer trimestre de embarazo (Third Trimester of Pregnancy) El tercer trimestre va desde la semana29 hasta la 42, desde el sptimo hasta el noveno mes, y es la poca en la que el feto crece ms rpidamente. Hacia el final del noveno mes, el feto mide alrededor de 20pulgadas (45cm) de largo y pesa entre 6 y 10 libras (2,700 y 4,500kg).  CAMBIOS EN EL ORGANISMO Su organismo atraviesa por muchos cambios durante el embarazo, y estos varan de una mujer a otra.   Seguir aumentando de peso. Es de esperar que aumente entre 25 y 35libras (11 y 16kg) hacia el final del embarazo.  Podrn aparecer las primeras estras en las caderas, el abdomen y las mamas.  Puede tener necesidad de orinar con ms frecuencia porque el feto baja hacia la pelvis y ejerce presin sobre la vejiga.  Debido al embarazo podr sentir acidez estomacal con frecuencia.  Puede estar estreida, ya que ciertas hormonas enlentecen los movimientos de los msculos que empujan los desechos a travs de los intestinos.  Pueden aparecer hemorroides o abultarse e hincharse las venas (venas varicosas).  Puede sentir dolor plvico debido al aumento de peso y a que las hormonas del embarazo relajan las articulaciones entre los huesos de la pelvis. El dolor de espalda puede ser consecuencia de la sobrecarga de los msculos que soportan la postura.  Tal vez haya cambios en el cabello que pueden incluir su engrosamiento, crecimiento rpido y cambios en la textura. Adems, a algunas mujeres se les cae el cabello durante o despus del embarazo, o tienen el cabello seco o fino. Lo ms probable es que el cabello se le normalice despus del nacimiento del beb.  Las mamas seguirn creciendo y le dolern. A veces, puede haber una secrecin amarilla de las mamas llamada calostro.  El ombligo puede salir hacia afuera.  Puede sentir que le falta el aire debido a que se expande el tero.  Puede notar que el feto "baja" o lo siente ms bajo, en el  abdomen.  Puede tener una prdida de secrecin mucosa con sangre. Esto suele ocurrir en el trmino de unos pocos das a una semana antes de que comience el trabajo de parto.  El cuello del tero se vuelve delgado y blando (se borra) cerca de la fecha de parto. QU DEBE ESPERAR EN LOS EXMENES PRENATALES  Le harn exmenes prenatales cada 2semanas hasta la semana36. A partir de ese momento le harn exmenes semanales. Durante una visita prenatal de rutina:  La pesarn para asegurarse de que usted y el feto estn creciendo normalmente.  Le tomarn la presin arterial.  Le medirn el abdomen para controlar el desarrollo del beb.  Se escucharn los latidos cardacos fetales.  Se evaluarn los resultados de los estudios solicitados en visitas anteriores.  Le revisarn el cuello del tero cuando est prxima la fecha de parto para controlar si este se ha borrado. Alrededor de la semana36, el mdico le revisar el cuello del tero. Al mismo tiempo, realizar un anlisis de las secreciones del tejido vaginal. Este examen es para determinar si hay un tipo de bacteria, estreptococo Grupo B. El mdico le explicar esto con ms detalle. El mdico puede preguntarle lo siguiente:  Cmo le gustara que fuera el parto.  Cmo se siente.  Si siente los movimientos del beb.  Si ha tenido sntomas anormales, como prdida de lquido, sangrado, dolores de cabeza intensos o clicos abdominales.  Si tiene alguna pregunta. Otros exmenes o estudios de deteccin que pueden realizarse   durante el tercer trimestre incluyen lo siguiente:  Anlisis de sangre para controlar las concentraciones de hierro (anemia).  Controles fetales para determinar su salud, nivel de actividad y crecimiento. Si tiene alguna enfermedad o hay problemas durante el embarazo, le harn estudios. FALSO TRABAJO DE PARTO Es posible que sienta contracciones leves e irregulares que finalmente desaparecen. Se llaman contracciones de  Braxton Hicks o falso trabajo de parto. Las contracciones pueden durar horas, das o incluso semanas, antes de que el verdadero trabajo de parto se inicie. Si las contracciones ocurren a intervalos regulares, se intensifican o se hacen dolorosas, lo mejor es que la revise el mdico.  SIGNOS DE TRABAJO DE PARTO   Clicos de tipo menstrual.  Contracciones cada 5minutos o menos.  Contracciones que comienzan en la parte superior del tero y se extienden hacia abajo, a la zona inferior del abdomen y la espalda.  Sensacin de mayor presin en la pelvis o dolor de espalda.  Una secrecin de mucosidad acuosa o con sangre que sale de la vagina. Si tiene alguno de estos signos antes de la semana37 del embarazo, llame a su mdico de inmediato. Debe concurrir al hospital para que la controlen inmediatamente. INSTRUCCIONES PARA EL CUIDADO EN EL HOGAR   Evite fumar, consumir hierbas, beber alcohol y tomar frmacos que no le hayan recetado. Estas sustancias qumicas afectan la formacin y el desarrollo del beb.  Siga las indicaciones del mdico en relacin con el uso de medicamentos. Durante el embarazo, hay medicamentos que son seguros de tomar y otros que no.  Haga actividad fsica solo en la forma indicada por el mdico. Sentir clicos uterinos es un buen signo para detener la actividad fsica.  Contine comiendo alimentos que sanos con regularidad.  Use un sostn que le brinde buen soporte si le duelen las mamas.  No se d baos de inmersin en agua caliente, baos turcos ni saunas.  Colquese el cinturn de seguridad cuando conduzca.  No coma carne cruda ni queso sin cocinar; evite el contacto con las bandejas sanitarias de los gatos y la tierra que estos animales usan. Estos elementos contienen grmenes que pueden causar defectos congnitos en el beb.  Tome las vitaminas prenatales.  Si est estreida, pruebe un laxante suave (si el mdico lo autoriza). Consuma ms alimentos ricos en  fibra, como vegetales y frutas frescos y cereales integrales. Beba gran cantidad de lquido para mantener la orina de tono claro o color amarillo plido.  Dese baos de asiento con agua tibia para aliviar el dolor o las molestias causadas por las hemorroides. Use una crema para las hemorroides si el mdico la autoriza.  Si tiene venas varicosas, use medias de descanso. Eleve los pies durante 15minutos, 3 o 4veces por da. Limite la cantidad de sal en su dieta.  Evite levantar objetos pesados, use zapatos de tacones bajos y mantenga una buena postura.  Descanse con las piernas elevadas si tiene calambres o dolor de cintura.  Visite a su dentista si no lo ha hecho durante el embarazo. Use un cepillo de dientes blando para higienizarse los dientes y psese el hilo dental con suavidad.  Puede seguir manteniendo relaciones sexuales, a menos que el mdico le indique lo contrario.  No haga viajes largos excepto que sea absolutamente necesario y solo con la autorizacin del mdico.  Tome clases prenatales para entender, practicar y hacer preguntas sobre el trabajo de parto y el parto.  Haga un ensayo de la partida al hospital.  Prepare el bolso que   llevar al hospital.  Prepare la habitacin del beb.  Concurra a todas las visitas prenatales segn las indicaciones de su mdico. SOLICITE ATENCIN MDICA SI:  No est segura de que est en trabajo de parto o de que ha roto la bolsa de las aguas.  Tiene mareos.  Siente clicos leves, presin en la pelvis o dolor persistente en el abdomen.  Tiene nuseas, vmitos o diarrea persistentes.  Tiene secrecin vaginal con mal olor.  Siente dolor al orinar. SOLICITE ATENCIN MDICA DE INMEDIATO SI:   Tiene fiebre.  Tiene una prdida de lquido por la vagina.  Tiene sangrado o pequeas prdidas vaginales.  Siente dolor intenso o clicos en el abdomen.  Sube o baja de peso rpidamente.  Tiene dificultad para respirar y siente dolor de  pecho.  Sbitamente se le hinchan mucho el rostro, las manos, los tobillos, los pies o las piernas.  No ha sentido los movimientos del beb durante una hora.  Siente un dolor de cabeza intenso que no se alivia con medicamentos.  Hay cambios en la visin. Document Released: 02/27/2005 Document Revised: 05/25/2013 ExitCare Patient Information 2015 [employer]. This information is not intended to replace advice given to you by your health care provider. Make sure you discuss any questions you have with your health care provider.  

## 2014-01-18 ENCOUNTER — Ambulatory Visit (INDEPENDENT_AMBULATORY_CARE_PROVIDER_SITE_OTHER): Payer: Self-pay | Admitting: Advanced Practice Midwife

## 2014-01-18 VITALS — BP 107/67 | HR 80 | Temp 98.7°F | Wt 198.5 lb

## 2014-01-18 DIAGNOSIS — Z3480 Encounter for supervision of other normal pregnancy, unspecified trimester: Secondary | ICD-10-CM

## 2014-01-18 DIAGNOSIS — Z3483 Encounter for supervision of other normal pregnancy, third trimester: Secondary | ICD-10-CM

## 2014-01-18 NOTE — Progress Notes (Signed)
Occasional edema in feet.  

## 2014-01-18 NOTE — Patient Instructions (Addendum)
Tums Pepcid 20 mg twice a day  [place]Lactancia materna (Breastfeeding) Decidir Museum/gallery exhibitions officeramamantar es una de las mejores elecciones que puede hacer por usted y su beb. El cambio hormonal durante el Psychiatristembarazo produce el desarrollo del tejido mamario y Lesothoaumenta la cantidad y el tamao de los conductos galactforos. Estas hormonas tambin permiten que las protenas, los azcares y las grasas de la sangre produzcan la WPS Resourcesleche materna en las glndulas productoras de [place]leche. Las hormonas impiden que la leche materna sea liberada antes del nacimiento del beb, adems de impulsar el flujo de leche luego del nacimiento. Una vez que ha comenzado a Museum/gallery exhibitions officeramamantar, Conservation officer, naturepensar en el beb, as Immunologistcomo la succin o Theatre managerel llanto, pueden estimular la liberacin de [place] de las glndulas productoras de [place] Village[place]che.  LOS BENEFICIOS DE AMAMANTAR Para el beb  La primera leche (calostro) ayuda a Careers information officermejorar el funcionamiento del sistema digestivo del beb.  La leche tiene anticuerpos que ayudan a Radio producerprevenir las infecciones en el beb.  El beb tiene una menor incidencia de asma, alergias y del sndrome de muerte sbita del lactante.  Los nutrientes en la [place] Rippleleche materna son mejores para el beb que la [place] maternizada y estn preparados exclusivamente para cubrir las necesidades del beb.  La leche materna mejora el desarrollo cerebral del beb.  Es menos probable que el beb desarrolle otras enfermedades, como obesidad infantil, asma o diabetes mellitus de tipo 2. Para usted   La lactancia materna favorece el desarrollo de un vnculo muy especial entre la madre y el beb.  Es conveniente. La leche materna siempre est disponible a la Human resources officertemperatura correcta y es Oildaleeconmica.  La lactancia materna ayuda a quemar caloras y a perder el peso ganado durante el Townsendembarazo.  Favorece la contraccin del tero al tamao que tena antes del embarazo de manera ms rpida y disminuye el sangrado (loquios) despus del parto.  La lactancia materna contribuye a reducir Engineer, waterel  riesgo de desarrollar diabetes mellitus de tipo 2, osteoporosis o cncer de mama o de ovario en el futuro. SIGNOS DE QUE EL BEB EST HAMBRIENTO Primeros signos de [address]  Aumenta su estado de Lesothoalerta o actividad.  Se estira.  Mueve la cabeza de un lado a otro.  Mueve la cabeza y abre la boca cuando se le toca la mejilla o la comisura de la boca (reflejo de bsqueda).  Aumenta las vocalizaciones, tales como sonidos de succin, se relame los labios, emite arrullos, suspiros, o chirridos.  Mueve la Jones Apparel Groupmano hacia la boca.  Se chupa con ganas los dedos o las manos. Signos tardos de Fisher Scientifichambre  Est agitado.  Llora de manera intermitente. Signos de AES Corporationhambre extrema Los signos de hambre extrema requerirn que lo calme y lo consuele antes de que el beb pueda alimentarse adecuadamente. No espere a que se manifiesten los siguientes signos de hambre extrema para comenzar a Museum/gallery exhibitions officeramamantar:   Designer, jewelleryAgitacin.  Llanto intenso y fuerte.   Gritos. INFORMACIN BSICA SOBRE LA LACTANCIA MATERNA Iniciacin de la lactancia materna  Encuentre un lugar cmodo para sentarse o acostarse, con un buen respaldo para el cuello y la espalda.  Coloque una almohada o una manta enrollada debajo del beb para acomodarlo a la altura de la mama (si est sentada). Las almohadas para Museum/gallery exhibitions officeramamantar se han diseado especialmente a fin de servir de apoyo para los brazos y el beb Smithfield Foodsmientras amamanta.  Asegrese de que el abdomen del beb est frente al suyo.  Masajee suavemente la mama. Con las yemas de los dedos, masajee la pared del  pecho hacia el pezn en un movimiento circular. Esto estimula el flujo de [place]. Es posible que Engineer, manufacturing systems este movimiento mientras amamanta si la leche fluye lentamente.  Sostenga la mama con el pulgar por arriba del pezn y los otros 4 dedos por debajo de la mama. Asegrese de que los dedos se encuentren lejos del pezn y de la boca del beb.  Empuje suavemente los labios del beb con el pezn o con  el dedo.  Cuando la boca del beb se abra lo suficiente, acrquelo rpidamente a la mama e introduzca todo el pezn y la zona oscura que lo rodea (areola), tanto como sea posible, dentro de la boca del beb.  Debe haber ms areola visible por arriba del labio superior del beb que por debajo del labio inferior.  La lengua del beb debe estar entre la enca inferior y la [place].  Asegrese de que la boca del beb est en la posicin correcta alrededor del pezn (prendida). Los labios del beb deben crear un sello sobre la mama y estar doblados hacia afuera (invertidos).  Es comn que el beb succione durante 2 a 3 minutos para que comience el flujo de [place]. Cmo debe prenderse Es muy importante que le ensee al beb cmo prenderse adecuadamente a la mama. Si el beb no se prende adecuadamente, puede causarle dolor en el pezn y reducir la produccin de [place], y hacer que el beb tenga un escaso aumento de [place]. Adems, si el beb no se prende adecuadamente al pezn, puede tragar aire durante la alimentacin. Esto puede causarle molestias al beb. Hacer eructar al beb al [place] de mama puede ayudarlo a liberar el aire. Sin embargo, ensearle al beb cmo prenderse a la mama adecuadamente es la mejor manera de evitar que se sienta molesto por tragar Oceanographer se alimenta. Signos de que el beb se ha prendido adecuadamente al pezn:   Payton Doughty o succiona de modo silencioso, sin causarle dolor.  Se escucha que traga cada 3 o 4 succiones.   Hay movimientos musculares por arriba y por delante de sus odos al Printmaker. Signos de que el beb no se ha prendido Audiological scientist al pezn:   Hace ruidos de succin o de chasquido mientras se alimenta.  Siente dolor en el pezn. Si cree que el beb no se prendi correctamente, deslice el dedo en la comisura de la boca y Ameren Corporation las encas del beb para interrumpir la succin. Intente comenzar a amamantar nuevamente. Signos de  Fish farm manager Signos del beb:   Disminuye gradualmente el nmero de succiones o cesa la succin por completo.  Se duerme.  Relaja el cuerpo.  Retiene una pequea cantidad de [facility] boca.  Se desprende solo del pecho. Signos que presenta usted:  Las mamas han aumentado la firmeza, el peso y el tamao 1 a 3 horas despus de Museum/gallery exhibitions officer.  Estn ms blandas inmediatamente despus de amamantar.  Un aumento del volumen de [place], y tambin un cambio en su consistencia y color se producen hacia el quinto da de Tour manager.  Los pezones no duelen, ni estn agrietados ni sangran. Signos de que su beb recibe la cantidad de leche suficiente  Moja al menos 3 paales en 24 horas. La orina debe ser clara y de color amarillo plido a los [address] de [place].  Defeca al menos 3 veces en 24 horas a los [address] de [address] La materia fecal debe ser blanda y [place].  Defeca  al menos 3 veces en 24 horas a los 7 [address] de [address] La materia fecal debe ser grumosa y [place].  No registra una prdida de peso mayor del 10% del peso al nacer durante los primeros 3 [address] de [place].  Aumenta de peso un promedio de 4 a 7onzas (113 a 198g) por semana despus de los [address] de vida.  Aumenta de [place], [place], de [place] uniforme a Glass blower/designer de los [address] de vida, sin Passenger transport manager prdida de peso despus de las 2semanas de vida. Despus de alimentarse, es posible que el beb regurgite una pequea cantidad. Esto es frecuente. FRECUENCIA Y DURACIN DE LA LACTANCIA MATERNA El amamantamiento frecuente la ayudar a producir ms [place] y a Education officer, community de Engineer, mining en los pezones e hinchazn en las [place]. Alimente al beb cuando muestre signos de hambre o si siente la necesidad de reducir la congestin de las [place]. Esto se denomina "lactancia a demanda". Evite el uso del chupete mientras trabaja para establecer la lactancia (las primeras 4 a 6 semanas despus del nacimiento del beb). Despus de  este perodo, podr ofrecerle un chupete. Las investigaciones demostraron que el uso del chupete durante el primer ao de vida del beb disminuye el riesgo de desarrollar el sndrome de muerte sbita del lactante (SMSL). Permita que el nio se alimente en cada mama todo lo que desee. Contine amamantando al beb hasta que haya terminado de alimentarse. Cuando el beb se desprende o se queda dormido mientras se est alimentando de la primera mama, ofrzcale la segunda. Debido a que, con frecuencia, los recin [place] las primeras semanas de vida, es posible que deba despertar al beb para alimentarlo. Los horarios de Acupuncturist de un beb a otro. Sin embargo, las siguientes reglas pueden servir como gua para ayudarla a Lawyer que el beb se alimenta adecuadamente:  Se puede amamantar a los recin nacidos (bebs de 4 semanas o menos de vida) cada 1 a 3 horas.  No deben transcurrir ms de 3 horas durante el da o 5 horas durante la noche sin que se amamante a los recin nacidos.  Debe amamantar al beb 8 veces como mnimo en un perodo de 24 horas, hasta que comience a introducir slidos en su dieta, a los 6 meses de vida aproximadamente. EXTRACCIN DE [employer] Foods Company MATERNA La extraccin y Contractor de la leche materna le permiten asegurarse de que el beb se alimente exclusivamente de [place], aun en momentos en los que no puede amamantar. Esto tiene especial importancia si debe regresar al Aleen Campi en el perodo en que an est amamantando o si no puede estar presente en los momentos en que el beb debe alimentarse. Su asesor en lactancia puede orientarla sobre cunto tiempo es seguro almacenar [place].  El sacaleche es un aparato que le permite extraer leche de la mama a un recipiente estril. Luego, la leche materna extrada puede almacenarse en un refrigerador o Electrical engineer. Algunos sacaleches son Birdie Riddle, Delaney Meigs otros son elctricos. Consulte a su  asesor en lactancia qu tipo ser ms conveniente para usted. Los sacaleches se pueden comprar; sin embargo, algunos hospitales y grupos de apoyo a la lactancia materna alquilan Sports coach. Un asesor en lactancia puede ensearle cmo extraer W. [employer], en caso de que prefiera no usar un sacaleche.  CMO CUIDAR LAS MAMAS DURANTE LA LACTANCIA MATERNA Los pezones se secan, agrietan y duelen durante la Tour manager. Las siguientes recomendaciones pueden ayudarla a Pharmacologist las [employer]PP[employer] Partners y  sanas:  Evite usar Eaton Corporation.  Use un sostn de soporte. Aunque no son esenciales, las camisetas sin mangas o los sostenes especiales para Museum/gallery exhibitions officer estn diseados para acceder fcilmente a las mamas, para Museum/gallery exhibitions officer sin tener que quitarse todo el sostn o la camiseta. Evite usar sostenes con aro o sostenes muy ajustados.  Seque al aire sus pezones durante 3 a despus de amamantar al beb.  Utilice solo apsitos de Haematologist sostn para Environmental health practitioner las prdidas de [place]. La prdida de un poco de Public Service Enterprise Group tomas es normal.  Utilice lanolina sobre los pezones luego de Museum/gallery exhibitions officer. La lanolina ayuda a mantener la humedad normal de la piel. Si [place] lanolina pura, no tiene que lavarse los pezones antes de volver a Corporate treasurer al beb. La lanolina pura no es txica para el beb. Adems, puede extraer Beazer Homes algunas gotas de Radar Base materna y Engineer, maintenance (IT) suavemente esa Winn[employer]Dix[employer], para que la [place] se seque al aire. Durante las primeras semanas despus de dar a luz, algunas mujeres pueden experimentar hinchazn en las mamas (congestin [place]). La congestin puede hacer que sienta las mamas pesadas, calientes y sensibles al tacto. El pico de la congestin ocurre dentro de los 3 a 5 das despus del [place]. Las siguientes recomendaciones pueden ayudarla a Paramedic la congestin:  Vace por completo las mamas al QUALCOMM o Environmental health practitioner. Puede  aplicar calor hmedo en las mamas (en la ducha o con toallas hmedas para manos) antes de Museum/gallery exhibitions officer o extraer WPS Resources. Esto aumenta la circulacin y [place] a que la [place]. Si el beb no vaca por completo las [address] cuando lo [address], extraiga la [place] restante despus de que haya finalizado.  Use un sostn ajustado (para amamantar o comn) o una camiseta sin mangas durante 1 o 2 das para indicar al cuerpo que disminuya ligeramente la produccin de [place].  Aplique compresas de hielo Yahoo[employer] Inc, a menos que le resulte demasiado incmodo.  Asegrese de que el beb est prendido y se encuentre en la posicin correcta mientras lo alimenta. Si la congestin persiste luego de 48 horas o despus de seguir estas recomendaciones, comunquese con su mdico o un Holiday representative. RECOMENDACIONES GENERALES PARA EL CUIDADO DE LA SALUD DURANTE LA LACTANCIA MATERNA  Consuma alimentos saludables. Alterne comidas y colaciones, y coma 3 de cada una por da. Dado que lo que come Danaher Corporation, es posible que algunas comidas hagan que su beb se vuelva ms irritable de lo habitual. Evite comer este tipo de alimentos si percibe que afectan de manera negativa al beb.  Beba leche, jugos de fruta y agua para Patent examiner su sed (aproximadamente 10 vasos al Futures trader).  Descanse con frecuencia, reljese y tome sus vitaminas prenatales para evitar la fatiga, el estrs y la anemia.  Contine con los autocontroles de la mama.  Evite masticar y fumar tabaco.  Evite el consumo de alcohol y drogas. Algunos medicamentos, que pueden ser perjudiciales para el beb, pueden pasar a travs de la Colgate Pal[employer]ive. Es importante que consulte a su mdico antes de Medical sales representative, incluidos todos los medicamentos recetados y de [place], as como los suplementos vitamnicos y herbales. Puede quedar embarazada durante la lactancia. Si desea controlar la natalidad, consulte a su mdico cules son las opciones  ms seguras para el beb. SOLICITE ATENCIN MDICA SI:   Usted siente que quiere dejar de Museum/gallery exhibitions officer o se siente frustrada con la lactancia.  Siente dolor  en las mamas o en los pezones.  Sus pezones estn agrietados o Water quality scientist.  Sus pechos estn irritados, sensibles o calientes.  Tiene un rea hinchada en cualquiera de las mamas.  Siente escalofros o fiebre.  Tiene nuseas o vmitos.  Presenta una secrecin de otro lquido distinto de la leche materna de los pezones.  Sus mamas no se llenan antes de Museum/gallery exhibitions officer al beb para el quinto da despus del [place].  Se siente triste y deprimida.  El beb est demasiado somnoliento como para comer bien.  El beb tiene problemas para dormir.  Moja menos de 3 paales en 24 horas.  Defeca menos de 3 veces en 24 horas.  La piel del beb o la parte blanca de los ojos se vuelven amarillentas.  El beb no ha aumentado de [place] a los [address] de [place]. SOLICITE ATENCIN MDICA DE INMEDIATO SI:   El beb est muy cansado Retail buyer) y no se quiere despertar para comer.  Le sube la fiebre sin causa. Document Released: 05/20/2005 Document Revised: 05/25/2013 [facility] Patient Information 2015 [place], [place]. This information is not intended to replace advice given to you by your health care provider. Make sure you discuss any questions you have with your health care provider.  Contracciones de Designer, multimedia (Braxton Hicks Contractions) Durante el [place], pueden presentarse contracciones uterinas que no siempre indican que est en [place].  QU SON LAS CONTRACCIONES DE BRAXTON HICKS?  Las State Farm se presentan antes del [place] de [place] se conocen como contracciones de [place] o falso trabajo de [place]. Hacia el final del embarazo (32 a 34semanas), estas contracciones pueden aparecen con ms frecuencia y volverse ms intensas. No corresponden al Aleen Campi de parto verdadero porque estas contracciones no producen el  agrandamiento (la dilatacin) y el afinamiento del cuello del tero. Algunas veces, es difcil distinguirlas del trabajo de parto verdadero porque en algunos casos pueden ser D.R. Horton, Inc, y las personas tienen diferentes niveles de tolerancia al Merck & Co. No debe sentirse avergonzada si concurre al hospital con falso trabajo de [place]. En ocasiones, la nica forma de saber si el trabajo de parto es verdadero es que el mdico determine si hay cambios en el cuello del tero. Si no hay problemas prenatales u otras complicaciones de salud asociadas con el embarazo, no habr inconvenientes si la envan a su casa con falso trabajo de parto y espera que comience el verdadero. CMO DIFERENCIAR EL TRABAJO DE PARTO FALSO DEL VERDADERO Falso trabajo de parto  Las contracciones del falso trabajo de parto duran menos y no son tan intensas como las verdaderas.  Generalmente son irregulares.  A menudo, se sienten en la parte delantera de la parte baja del abdomen y en la ingle,  y pueden desaparecer cuando camina o cambia de posicin mientras est acostada.  Las contracciones se vuelven ms dbiles y su duracin es Adult nurse a medida que el tiempo transcurre.  Por lo general, no se hacen progresivamente ms intensas, regulares y Herbalist entre s como en el caso del [place] de parto verdadero. Theodis Blaze de parto  Las contracciones del verdadero trabajo de parto duran de 30 a 70segundos, son muy regulares y suelen volverse ms intensas, y [place] su frecuencia.  No desaparecen cuando camina.  La molestia generalmente se siente en la parte superior del tero y se extiende hacia la zona inferior del abdomen y Parker Hannifin cintura.  El mdico podr examinarla para determinar si el trabajo de parto es verdadero. El Ecologist  si el cuello del tero se est dilatando y afinando. LO QUE DEBE RECORDAR  Contine haciendo los ejercicios habituales y siga otras indicaciones que el mdico le d.  Tome todos los  medicamentos como le indic el mdico.  Oceanographer a las visitas prenatales regulares.  Coma y beba con moderacin si cree que est en trabajo de parto.  Si las contracciones de Dole Food provocan incomodidad:  Cambie de posicin: si est acostada o descansando, camine; si est caminando, descanse.  Sintese y descanse en una baera con agua tibia.  Beba 2 o 3vasos de [place]. La deshidratacin puede provocar contracciones.  Respire lenta y profundamente varias veces por hora. CUNDO DEBO BUSCAR ASISTENCIA MDICA INMEDIATA? Solicite atencin mdica de inmediato si:  Las contracciones se intensifican, se hacen ms regulares y Arboriculturist s.  Tiene una prdida de lquido por la vagina.  Tiene fiebre.  Elimina mucosidad manchada con [place].  Tiene una hemorragia vaginal abundante.  Tiene dolor abdominal permanente.  Tiene un dolor en la zona lumbar que nunca tuvo antes.  Siente que la cabeza del beb empuja hacia abajo y ejerce presin en la zona plvica.  El beb no se mueve Dentist. Document Released: 02/27/2005 Document Revised: 05/25/2013 [facility] Patient Information 2015 [place], [place]. This information is not intended to replace advice given to you by your health care provider. Make sure you discuss any questions you have with your health care provider.

## 2014-01-26 ENCOUNTER — Ambulatory Visit (INDEPENDENT_AMBULATORY_CARE_PROVIDER_SITE_OTHER): Payer: Self-pay | Admitting: Advanced Practice Midwife

## 2014-01-26 VITALS — BP 90/57 | HR 99 | Temp 97.5°F | Wt 199.5 lb

## 2014-01-26 DIAGNOSIS — Z3493 Encounter for supervision of normal pregnancy, unspecified, third trimester: Secondary | ICD-10-CM

## 2014-01-26 DIAGNOSIS — O34219 Maternal care for unspecified type scar from previous cesarean delivery: Secondary | ICD-10-CM

## 2014-01-26 DIAGNOSIS — Z3480 Encounter for supervision of other normal pregnancy, unspecified trimester: Secondary | ICD-10-CM

## 2014-01-26 LAB — POCT URINALYSIS DIP (DEVICE)
Bilirubin Urine: NEGATIVE
Glucose, UA: NEGATIVE mg/dL
HGB URINE DIPSTICK: NEGATIVE
Ketones, ur: NEGATIVE mg/dL
Nitrite: NEGATIVE
PROTEIN: NEGATIVE mg/dL
Specific Gravity, Urine: 1.015 (ref 1.005–1.030)
UROBILINOGEN UA: 0.2 mg/dL (ref 0.0–1.0)
pH: 6 (ref 5.0–8.0)

## 2014-01-26 NOTE — Progress Notes (Signed)
Cultures today 

## 2014-01-26 NOTE — Progress Notes (Signed)
Joined by interpretor. Patient reports feeling well. No significant concerns today. She has been feeling baby movement frequently. No pediatric care lined up yet however she plans to have baby go to same clinic as her other 2 children. [facility]). Some low back pain and GERD reported (this was experienced with prior pregnancies. Other 2 pregnancies were both full term. No reported contractions. Cervical exam performed in office. Closed and thick.

## 2014-01-26 NOTE — Patient Instructions (Signed)
Contracciones de Braxton Hicks °(Braxton Hicks Contractions) °Durante el embarazo, pueden presentarse contracciones uterinas que no siempre indican que está en trabajo de parto.  °¿QUÉ SON LAS CONTRACCIONES DE BRAXTON HICKS?  °Las contracciones que se presentan antes del trabajo de parto se conocen como contracciones de Braxton Hicks o falso trabajo de parto. Hacia el final del embarazo (32 a 34 semanas), estas contracciones pueden aparecen con más frecuencia y volverse más intensas. No corresponden al trabajo de parto verdadero porque estas contracciones no producen el agrandamiento (la dilatación) y el afinamiento del cuello del útero. Algunas veces, es difícil distinguirlas del trabajo de parto verdadero porque en algunos casos pueden ser muy intensas, y las personas tienen diferentes niveles de tolerancia al dolor. No debe sentirse avergonzada si concurre al hospital con falso trabajo de parto. En ocasiones, la única forma de saber si el trabajo de parto es verdadero es que el médico determine si hay cambios en el cuello del útero. °Si no hay problemas prenatales u otras complicaciones de salud asociadas con el embarazo, no habrá inconvenientes si la envían a su casa con falso trabajo de parto y espera que comience el verdadero. °CÓMO DIFERENCIAR EL TRABAJO DE PARTO FALSO DEL VERDADERO °Falso trabajo de parto °· Las contracciones del falso trabajo de parto duran menos y no son tan intensas como las verdaderas. °· Generalmente son irregulares. °· A menudo, se sienten en la parte delantera de la parte baja del abdomen y en la ingle, °· y pueden desaparecer cuando camina o cambia de posición mientras está acostada. °· Las contracciones se vuelven más débiles y su duración es menor a medida que el tiempo transcurre. °· Por lo general, no se hacen progresivamente más intensas, regulares y cercanas entre sí como en el caso del trabajo de parto verdadero. °Verdadero trabajo de parto °· Las contracciones del verdadero  trabajo de parto duran de 30 a 70 segundos, son muy regulares y suelen volverse más intensas, y aumenta su frecuencia. °· No desaparecen cuando camina. °· La molestia generalmente se siente en la parte superior del útero y se extiende hacia la zona inferior del abdomen y hacia la cintura. °· El médico podrá examinarla para determinar si el trabajo de parto es verdadero. El examen mostrará si el cuello del útero se está dilatando y afinando. °LO QUE DEBE RECORDAR °· Continúe haciendo los ejercicios habituales y siga otras indicaciones que el médico le dé. °· Tome todos los medicamentos como le indicó el médico. °· Concurra a las visitas prenatales regulares. °· Coma y beba con moderación si cree que está en trabajo de parto. °· Si las contracciones de Braxton Hicks le provocan incomodidad: °¨ Cambie de posición: si está acostada o descansando, camine; si está caminando, descanse. °¨ Siéntese y descanse en una bañera con agua tibia. °¨ Beba 2 o 3 vasos de agua. La deshidratación puede provocar contracciones. °¨ Respire lenta y profundamente varias veces por hora. °¿CUÁNDO DEBO BUSCAR ASISTENCIA MÉDICA INMEDIATA? °Solicite atención médica de inmediato si: °· Las contracciones se intensifican, se hacen más regulares y cercanas entre sí. °· Tiene una pérdida de líquido por la vagina. °· Tiene fiebre. °· Elimina mucosidad manchada con sangre. °· Tiene una hemorragia vaginal abundante. °· Tiene dolor abdominal permanente. °· Tiene un dolor en la zona lumbar que nunca tuvo antes. °· Siente que la cabeza del bebé empuja hacia abajo y ejerce presión en la zona pélvica. °· El bebé no se mueve tanto como solía. °Document Released: 02/27/2005 Document Revised: 05/25/2013 °ExitCare® Patient   Information ©2015 [employer]. This information is not intended to replace advice given to you by your health care provider. Make sure you discuss any questions you have with your health care provider. ° °

## 2014-01-26 NOTE — Addendum Note (Signed)
Addended by: Sherre Lain A on: 01/26/2014 03:24 PM   Modules accepted: Orders

## 2014-02-04 ENCOUNTER — Ambulatory Visit (INDEPENDENT_AMBULATORY_CARE_PROVIDER_SITE_OTHER): Payer: Self-pay | Admitting: Physician Assistant

## 2014-02-04 VITALS — BP 123/66 | HR 86 | Wt 202.4 lb

## 2014-02-04 DIAGNOSIS — Z3493 Encounter for supervision of normal pregnancy, unspecified, third trimester: Secondary | ICD-10-CM

## 2014-02-04 DIAGNOSIS — Z3480 Encounter for supervision of other normal pregnancy, unspecified trimester: Secondary | ICD-10-CM

## 2014-02-04 LAB — POCT URINALYSIS DIP (DEVICE)
Glucose, UA: 100 mg/dL — AB
Hgb urine dipstick: NEGATIVE
Ketones, ur: NEGATIVE mg/dL
NITRITE: NEGATIVE
PH: 5.5 (ref 5.0–8.0)
Protein, ur: NEGATIVE mg/dL
Specific Gravity, Urine: 1.02 (ref 1.005–1.030)
UROBILINOGEN UA: 1 mg/dL (ref 0.0–1.0)

## 2014-02-04 LAB — OB RESULTS CONSOLE GC/CHLAMYDIA
CHLAMYDIA, DNA PROBE: NEGATIVE
Gonorrhea: NEGATIVE

## 2014-02-04 LAB — OB RESULTS CONSOLE GBS: GBS: NEGATIVE

## 2014-02-04 NOTE — Patient Instructions (Signed)
Tercer trimestre del embarazo  (Third Trimester of Pregnancy)  El tercer trimestre del embarazo abarca desde la semana 29 hasta la semana 42, desde el 7 mes hasta el 9. En este trimestre el beb (feto) se desarrolla muy rpidamente. Hacia el final del noveno mes, el beb que an no ha nacido mide alrededor de 20 pulgadas (45 cm) de largo. Y pesa entre 6 y 10 libras (2,700 y 4,500 kg).  CUIDADOS EN EL HOGAR   Evite fumar, consumir hierbas y beber alcohol. Evite los frmacos que no apruebe el mdico.  Slo tome los medicamentos que le haya indicado su mdico. Algunos medicamentos son seguros para tomar durante el embarazo y otros no lo son.  Haga ejercicios slo como le indique el mdico. Deje de hacer ejercicios si comienza a tener clicos.  Haga comidas regulares y sanas.  Use un sostn que le brinde buen soporte si sus mamas estn sensibles.  No utilice la baera con agua caliente, baos turcos y saunas.  Colquese el cinturn de seguridad cuando conduzca.  Evite comer carne cruda y el contacto con los utensilios y desperdicios de los gatos.  Tome las vitaminas indicadas para la etapa prenatal.  Trate de tomar medicamentos para mover el intestino (laxantes) segn lo necesario y si su mdico la autoriza. Consuma ms fibra comiendo frutas y vegetales frescos y granos enteros. Beba gran cantidad de lquido para mantener el pis (orina) de tono claro o amarillo plido.  Tome baos de agua tibia (baos de asiento) para calmar el dolor o las molestias causadas por las hemorroides. Use una crema para las hemorroides si el mdico la autoriza.  Si tiene venas hinchadas y abultadas (venas varicosas), use medias de soporte. Eleve (levante) los pies durante 15 minutos, 3 o 4 veces por da. Limite el consumo de sal en su dieta.  Evite levantar objetos pesados, usar tacones altos y sintese derecha.  Descanse con las piernas elevadas si tiene calambres o dolor de cintura.  Visite a su dentista  si no lo ha hecho durante el embarazo. Use un cepillo de dientes blando para higienizarse los dientes. Use suavemente el hilo dental.  Puede tener sexo (relaciones sexuales) siempre que el mdico la autorice.  No viaje por largas distancias si puede evitarlo. Slo hgalo con la aprobacin de su mdico.  Haga el curso pre parto.  Practique conducir hasta el hospital.  Prepare el bolso que llevar.  Prepare la habitacin del beb.  Concurra a los controles mdicos. SOLICITE AYUDA SI:   No est segura si est en trabajo de parto o ha roto la bolsa de aguas.  Tiene mareos.  Siente clicos intensos o presin en la zona baja del vientre (abdomen).  Siente un dolor persistente en la zona del vientre.  Tiene malestar estomacal (nuseas), devuelve (vomita), o tiene deposiciones acuosas (diarrea).  Advierte un olor ftido que proviene de la vagina.  Siente dolor al hacer pis (orinar). SOLICITE AYUDA DE INMEDIATO SI:   Tiene fiebre.  Pierde lquido o sangre por la vagina.  Tiene sangrando o pequeas prdidas vaginales.  Siente dolor intenso o clicos en el abdomen.  Sube o baja de peso rpidamente.  Tiene dificultad para respirar o siente dolor en el pecho.  Sbitamente se le hinchan el rostro, las manos, los tobillos, los pies o las piernas.  No ha sentido los movimientos del beb durante una hora.  Siente un dolor de cabeza intenso que no se alivia con medicamentos.  Su visin se modifica. Document   Released: 01/20/2013 ExitCare Patient Information 2015 [employer]. This information is not intended to replace advice given to you by your health care provider. Make sure you discuss any questions you have with your health care provider.  

## 2014-02-04 NOTE — Progress Notes (Signed)
38 weeks IUP, no complaints GBS/GC/Chlam collected today RTC 1 week

## 2014-02-08 LAB — GC/CHLAMYDIA PROBE AMP
CT Probe RNA: NEGATIVE
GC PROBE AMP APTIMA: NEGATIVE

## 2014-02-09 LAB — CULTURE, BETA STREP (GROUP B ONLY)

## 2014-02-22 ENCOUNTER — Encounter: Payer: Self-pay | Admitting: Obstetrics and Gynecology

## 2014-02-22 ENCOUNTER — Ambulatory Visit (INDEPENDENT_AMBULATORY_CARE_PROVIDER_SITE_OTHER): Payer: Medicaid Other | Admitting: Obstetrics and Gynecology

## 2014-02-22 VITALS — BP 114/69 | HR 94 | Temp 98.3°F | Wt 205.1 lb

## 2014-02-22 DIAGNOSIS — O480 Post-term pregnancy: Secondary | ICD-10-CM

## 2014-02-22 LAB — POCT URINALYSIS DIP (DEVICE)
Bilirubin Urine: NEGATIVE
Glucose, UA: 500 mg/dL — AB
KETONES UR: NEGATIVE mg/dL
Nitrite: NEGATIVE
PROTEIN: NEGATIVE mg/dL
Specific Gravity, Urine: 1.02 (ref 1.005–1.030)
UROBILINOGEN UA: 0.2 mg/dL (ref 0.0–1.0)
pH: 5 (ref 5.0–8.0)

## 2014-02-22 NOTE — Progress Notes (Signed)
Interpreter here. Changed BEDD after review of dating criteria. Now remembers that she was menstruating on Christmas (c/w [place]). Pt was already getting NST for presumed postdates.  Still wants TOLAC. GERD sx still> try Tums and Rx Protonix to fill if needed. Neg cultures. Plans and S/Sx labor reviewed.

## 2014-02-22 NOTE — Patient Instructions (Signed)
Tercer trimestre de embarazo (Third Trimester of Pregnancy) El tercer trimestre va desde la semana29 hasta la 42, desde el sptimo hasta el noveno mes, y es la poca en la que el feto crece ms rpidamente. Hacia el final del noveno mes, el feto mide alrededor de 20pulgadas (45cm) de largo y pesa entre 6 y 10 libras (2,700 y 4,500kg).  CAMBIOS EN EL ORGANISMO Su organismo atraviesa por muchos cambios durante el embarazo, y estos varan de una mujer a otra.   Seguir aumentando de peso. Es de esperar que aumente entre 25 y 35libras (11 y 16kg) hacia el final del embarazo.  Podrn aparecer las primeras estras en las caderas, el abdomen y las mamas.  Puede tener necesidad de orinar con ms frecuencia porque el feto baja hacia la pelvis y ejerce presin sobre la vejiga.  Debido al embarazo podr sentir acidez estomacal con frecuencia.  Puede estar estreida, ya que ciertas hormonas enlentecen los movimientos de los msculos que empujan los desechos a travs de los intestinos.  Pueden aparecer hemorroides o abultarse e hincharse las venas (venas varicosas).  Puede sentir dolor plvico debido al aumento de peso y a que las hormonas del embarazo relajan las articulaciones entre los huesos de la pelvis. El dolor de espalda puede ser consecuencia de la sobrecarga de los msculos que soportan la postura.  Tal vez haya cambios en el cabello que pueden incluir su engrosamiento, crecimiento rpido y cambios en la textura. Adems, a algunas mujeres se les cae el cabello durante o despus del embarazo, o tienen el cabello seco o fino. Lo ms probable es que el cabello se le normalice despus del nacimiento del beb.  Las mamas seguirn creciendo y le dolern. A veces, puede haber una secrecin amarilla de las mamas llamada calostro.  El ombligo puede salir hacia afuera.  Puede sentir que le falta el aire debido a que se expande el tero.  Puede notar que el feto "baja" o lo siente ms bajo, en el  abdomen.  Puede tener una prdida de secrecin mucosa con sangre. Esto suele ocurrir en el trmino de unos pocos das a una semana antes de que comience el trabajo de parto.  El cuello del tero se vuelve delgado y blando (se borra) cerca de la fecha de parto. QU DEBE ESPERAR EN LOS EXMENES PRENATALES  Le harn exmenes prenatales cada 2semanas hasta la semana36. A partir de ese momento le harn exmenes semanales. Durante una visita prenatal de rutina:  La pesarn para asegurarse de que usted y el feto estn creciendo normalmente.  Le tomarn la presin arterial.  Le medirn el abdomen para controlar el desarrollo del beb.  Se escucharn los latidos cardacos fetales.  Se evaluarn los resultados de los estudios solicitados en visitas anteriores.  Le revisarn el cuello del tero cuando est prxima la fecha de parto para controlar si este se ha borrado. Alrededor de la semana36, el mdico le revisar el cuello del tero. Al mismo tiempo, realizar un anlisis de las secreciones del tejido vaginal. Este examen es para determinar si hay un tipo de bacteria, estreptococo Grupo B. El mdico le explicar esto con ms detalle. El mdico puede preguntarle lo siguiente:  Cmo le gustara que fuera el parto.  Cmo se siente.  Si siente los movimientos del beb.  Si ha tenido sntomas anormales, como prdida de lquido, sangrado, dolores de cabeza intensos o clicos abdominales.  Si tiene alguna pregunta. Otros exmenes o estudios de deteccin que pueden realizarse   durante el tercer trimestre incluyen lo siguiente:  Anlisis de sangre para controlar las concentraciones de hierro (anemia).  Controles fetales para determinar su salud, nivel de actividad y crecimiento. Si tiene alguna enfermedad o hay problemas durante el embarazo, le harn estudios. FALSO TRABAJO DE PARTO Es posible que sienta contracciones leves e irregulares que finalmente desaparecen. Se llaman contracciones de  Braxton Hicks o falso trabajo de parto. Las contracciones pueden durar horas, das o incluso semanas, antes de que el verdadero trabajo de parto se inicie. Si las contracciones ocurren a intervalos regulares, se intensifican o se hacen dolorosas, lo mejor es que la revise el mdico.  SIGNOS DE TRABAJO DE PARTO   Clicos de tipo menstrual.  Contracciones cada 5minutos o menos.  Contracciones que comienzan en la parte superior del tero y se extienden hacia abajo, a la zona inferior del abdomen y la espalda.  Sensacin de mayor presin en la pelvis o dolor de espalda.  Una secrecin de mucosidad acuosa o con sangre que sale de la vagina. Si tiene alguno de estos signos antes de la semana37 del embarazo, llame a su mdico de inmediato. Debe concurrir al hospital para que la controlen inmediatamente. INSTRUCCIONES PARA EL CUIDADO EN EL HOGAR   Evite fumar, consumir hierbas, beber alcohol y tomar frmacos que no le hayan recetado. Estas sustancias qumicas afectan la formacin y el desarrollo del beb.  Siga las indicaciones del mdico en relacin con el uso de medicamentos. Durante el embarazo, hay medicamentos que son seguros de tomar y otros que no.  Haga actividad fsica solo en la forma indicada por el mdico. Sentir clicos uterinos es un buen signo para detener la actividad fsica.  Contine comiendo alimentos que sanos con regularidad.  Use un sostn que le brinde buen soporte si le duelen las mamas.  No se d baos de inmersin en agua caliente, baos turcos ni saunas.  Colquese el cinturn de seguridad cuando conduzca.  No coma carne cruda ni queso sin cocinar; evite el contacto con las bandejas sanitarias de los gatos y la tierra que estos animales usan. Estos elementos contienen grmenes que pueden causar defectos congnitos en el beb.  Tome las vitaminas prenatales.  Si est estreida, pruebe un laxante suave (si el mdico lo autoriza). Consuma ms alimentos ricos en  fibra, como vegetales y frutas frescos y cereales integrales. Beba gran cantidad de lquido para mantener la orina de tono claro o color amarillo plido.  Dese baos de asiento con agua tibia para aliviar el dolor o las molestias causadas por las hemorroides. Use una crema para las hemorroides si el mdico la autoriza.  Si tiene venas varicosas, use medias de descanso. Eleve los pies durante 15minutos, 3 o 4veces por da. Limite la cantidad de sal en su dieta.  Evite levantar objetos pesados, use zapatos de tacones bajos y mantenga una buena postura.  Descanse con las piernas elevadas si tiene calambres o dolor de cintura.  Visite a su dentista si no lo ha hecho durante el embarazo. Use un cepillo de dientes blando para higienizarse los dientes y psese el hilo dental con suavidad.  Puede seguir manteniendo relaciones sexuales, a menos que el mdico le indique lo contrario.  No haga viajes largos excepto que sea absolutamente necesario y solo con la autorizacin del mdico.  Tome clases prenatales para entender, practicar y hacer preguntas sobre el trabajo de parto y el parto.  Haga un ensayo de la partida al hospital.  Prepare el bolso que   llevar al hospital.  Prepare la habitacin del beb.  Concurra a todas las visitas prenatales segn las indicaciones de su mdico. SOLICITE ATENCIN MDICA SI:  No est segura de que est en trabajo de parto o de que ha roto la bolsa de las aguas.  Tiene mareos.  Siente clicos leves, presin en la pelvis o dolor persistente en el abdomen.  Tiene nuseas, vmitos o diarrea persistentes.  Tiene secrecin vaginal con mal olor.  Siente dolor al orinar. SOLICITE ATENCIN MDICA DE INMEDIATO SI:   Tiene fiebre.  Tiene una prdida de lquido por la vagina.  Tiene sangrado o pequeas prdidas vaginales.  Siente dolor intenso o clicos en el abdomen.  Sube o baja de peso rpidamente.  Tiene dificultad para respirar y siente dolor de  pecho.  Sbitamente se le hinchan mucho el rostro, las manos, los tobillos, los pies o las piernas.  No ha sentido los movimientos del beb durante una hora.  Siente un dolor de cabeza intenso que no se alivia con medicamentos.  Hay cambios en la visin. Document Released: 02/27/2005 Document Revised: 05/25/2013 ExitCare Patient Information 2015 [employer]. This information is not intended to replace advice given to you by your health care provider. Make sure you discuss any questions you have with your health care provider.  

## 2014-03-01 ENCOUNTER — Encounter (HOSPITAL_COMMUNITY): Payer: Self-pay | Admitting: *Deleted

## 2014-03-01 ENCOUNTER — Encounter (HOSPITAL_COMMUNITY): Payer: Medicaid Other | Admitting: Anesthesiology

## 2014-03-01 ENCOUNTER — Inpatient Hospital Stay (HOSPITAL_COMMUNITY): Payer: Medicaid Other | Admitting: Anesthesiology

## 2014-03-01 ENCOUNTER — Inpatient Hospital Stay (HOSPITAL_COMMUNITY)
Admission: AD | Admit: 2014-03-01 | Discharge: 2014-03-02 | DRG: 775 | Disposition: A | Discharge status: Home / Self Care | Payer: Medicaid Other | Source: Ambulatory Visit | Attending: Obstetrics and Gynecology | Admitting: Obstetrics and Gynecology

## 2014-03-01 DIAGNOSIS — O34219 Maternal care for unspecified type scar from previous cesarean delivery: Secondary | ICD-10-CM | POA: Diagnosis present

## 2014-03-01 DIAGNOSIS — O480 Post-term pregnancy: Secondary | ICD-10-CM

## 2014-03-01 DIAGNOSIS — IMO0001 Reserved for inherently not codable concepts without codable children: Secondary | ICD-10-CM

## 2014-03-01 DIAGNOSIS — Z8249 Family history of ischemic heart disease and other diseases of the circulatory system: Secondary | ICD-10-CM | POA: Diagnosis not present

## 2014-03-01 DIAGNOSIS — Z833 Family history of diabetes mellitus: Secondary | ICD-10-CM | POA: Diagnosis not present

## 2014-03-01 DIAGNOSIS — O479 False labor, unspecified: Secondary | ICD-10-CM | POA: Diagnosis present

## 2014-03-01 LAB — TYPE AND SCREEN
ABO/RH(D): O POS
ANTIBODY SCREEN: NEGATIVE

## 2014-03-01 LAB — HIV ANTIBODY (ROUTINE TESTING W REFLEX): HIV: NONREACTIVE

## 2014-03-01 LAB — CBC
HCT: 41 % (ref 36.0–46.0)
Hemoglobin: 13.7 g/dL (ref 12.0–15.0)
MCH: 28.2 pg (ref 26.0–34.0)
MCHC: 33.4 g/dL (ref 30.0–36.0)
MCV: 84.4 fL (ref 78.0–100.0)
Platelets: 201 10*3/uL (ref 150–400)
RBC: 4.86 MIL/uL (ref 3.87–5.11)
RDW: 14.1 % (ref 11.5–15.5)
WBC: 12.3 10*3/uL — ABNORMAL HIGH (ref 4.0–10.5)

## 2014-03-01 LAB — ABO/RH: ABO/RH(D): O POS

## 2014-03-01 LAB — RPR

## 2014-03-01 MED ORDER — OXYTOCIN BOLUS FROM INFUSION
500.0000 mL | INTRAVENOUS | Status: DC
  Administered 2014-03-01: 500 mL via INTRAVENOUS

## 2014-03-01 MED ORDER — PHENYLEPHRINE 40 MCG/ML (10ML) SYRINGE FOR IV PUSH (FOR BLOOD PRESSURE SUPPORT)
80.0000 ug | PREFILLED_SYRINGE | INTRAVENOUS | Status: DC | PRN
  Filled 2014-03-01: qty 10
  Filled 2014-03-01: qty 2

## 2014-03-01 MED ORDER — OXYCODONE-ACETAMINOPHEN 5-325 MG PO TABS
2.0000 | ORAL_TABLET | ORAL | Status: DC | PRN
Start: 2014-03-01 — End: 2014-03-01

## 2014-03-01 MED ORDER — LACTATED RINGERS IV SOLN
INTRAVENOUS | Status: DC
  Administered 2014-03-01: 13:00:00 via INTRAVENOUS

## 2014-03-01 MED ORDER — ACETAMINOPHEN 325 MG PO TABS: 650.0000 mg | ORAL_TABLET | ORAL | Status: DC | PRN

## 2014-03-01 MED ORDER — FENTANYL CITRATE 0.05 MG/ML IJ SOLN: 100.0000 ug | INTRAMUSCULAR | Status: DC | PRN

## 2014-03-01 MED ORDER — CITRIC ACID-SODIUM CITRATE 334-500 MG/5ML PO SOLN: 30.0000 mL | ORAL | Status: DC | PRN

## 2014-03-01 MED ORDER — LIDOCAINE HCL (PF) 1 % IJ SOLN: 30.0000 mL | INTRAMUSCULAR | Status: DC | PRN

## 2014-03-01 MED ORDER — LACTATED RINGERS IV SOLN: 500.0000 mL | Freq: Once | INTRAVENOUS | Status: DC

## 2014-03-01 MED ORDER — ONDANSETRON HCL 4 MG/2ML IJ SOLN: 4.0000 mg | Freq: Four times a day (QID) | INTRAMUSCULAR | Status: DC | PRN

## 2014-03-01 MED ORDER — LIDOCAINE HCL (PF) 1 % IJ SOLN
INTRAMUSCULAR | Status: DC | PRN
  Administered 2014-03-01 (×3): 4 mL

## 2014-03-01 MED ORDER — OXYTOCIN BOLUS FROM INFUSION: 500.0000 mL | INTRAVENOUS | Status: DC

## 2014-03-01 MED ORDER — PHENYLEPHRINE 40 MCG/ML (10ML) SYRINGE FOR IV PUSH (FOR BLOOD PRESSURE SUPPORT)
80.0000 ug | PREFILLED_SYRINGE | INTRAVENOUS | Status: DC | PRN
  Filled 2014-03-01: qty 2

## 2014-03-01 MED ORDER — CITRIC ACID-SODIUM CITRATE 334-500 MG/5ML PO SOLN
30.0000 mL | ORAL | Status: DC | PRN
Start: 2014-03-01 — End: 2014-03-02

## 2014-03-01 MED ORDER — LIDOCAINE HCL (PF) 1 % IJ SOLN
30.0000 mL | INTRAMUSCULAR | Status: DC | PRN
  Filled 2014-03-01: qty 30

## 2014-03-01 MED ORDER — FLEET ENEMA 7-19 GM/118ML RE ENEM
1.0000 | ENEMA | Freq: Every day | RECTAL | Status: DC | PRN
Start: 2014-03-01 — End: 2014-03-02

## 2014-03-01 MED ORDER — LACTATED RINGERS IV SOLN: INTRAVENOUS | Status: DC

## 2014-03-01 MED ORDER — OXYCODONE-ACETAMINOPHEN 5-325 MG PO TABS: 1.0000 | ORAL_TABLET | ORAL | Status: DC | PRN

## 2014-03-01 MED ORDER — OXYTOCIN 40 UNITS IN LACTATED RINGERS INFUSION - SIMPLE MED
62.5000 mL/h | INTRAVENOUS | Status: DC
  Filled 2014-03-01: qty 1000

## 2014-03-01 MED ORDER — ACETAMINOPHEN 325 MG PO TABS
650.0000 mg | ORAL_TABLET | ORAL | Status: DC | PRN
Start: 2014-03-01 — End: 2014-03-02

## 2014-03-01 MED ORDER — OXYTOCIN 40 UNITS IN LACTATED RINGERS INFUSION - SIMPLE MED: 62.5000 mL/h | INTRAVENOUS | Status: DC

## 2014-03-01 MED ORDER — OXYCODONE-ACETAMINOPHEN 5-325 MG PO TABS: 2.0000 | ORAL_TABLET | ORAL | Status: DC | PRN

## 2014-03-01 MED ORDER — EPHEDRINE 5 MG/ML INJ
10.0000 mg | INTRAVENOUS | Status: DC | PRN
  Filled 2014-03-01: qty 2

## 2014-03-01 MED ORDER — FLEET ENEMA 7-19 GM/118ML RE ENEM: 1.0000 | ENEMA | RECTAL | Status: DC | PRN

## 2014-03-01 MED ORDER — FENTANYL 2.5 MCG/ML BUPIVACAINE 1/10 % EPIDURAL INFUSION (WH - ANES)
14.0000 mL/h | INTRAMUSCULAR | Status: DC | PRN
  Administered 2014-03-01: 12 mL/h via EPIDURAL
  Administered 2014-03-01: 14 mL/h via EPIDURAL
  Filled 2014-03-01 (×2): qty 125

## 2014-03-01 MED ORDER — LACTATED RINGERS IV SOLN: 500.0000 mL | INTRAVENOUS | Status: DC | PRN

## 2014-03-01 MED ORDER — DIPHENHYDRAMINE HCL 50 MG/ML IJ SOLN: 12.5000 mg | INTRAMUSCULAR | Status: DC | PRN

## 2014-03-01 NOTE — Anesthesia Procedure Notes (Signed)
Epidural Patient location during procedure: OB Start time: 03/01/2014 10:57 AM  Staffing Anesthesiologist: Carianna Lague Performed by: anesthesiologist   Preanesthetic Checklist Completed: patient identified, site marked, surgical consent, pre-op evaluation, timeout performed, IV checked, risks and benefits discussed and monitors and equipment checked  Epidural Patient position: sitting Prep: site prepped and draped and DuraPrep Patient monitoring: continuous pulse ox and blood pressure Approach: midline Location: L3-L4 Injection technique: LOR air  Needle:  Needle type: Tuohy  Needle gauge: 17 G Needle length: 9 cm and 9 Needle insertion depth: 4 cm Catheter type: closed end flexible Catheter size: 19 Gauge Catheter at skin depth: 9 cm Test dose: negative  Assessment Events: blood not aspirated, injection not painful, no injection resistance, negative IV test and no paresthesia  Additional Notes Discussed risk of headache, infection, bleeding, nerve injury and failed or incomplete block.  Patient voices understanding and wishes to proceed.  Epidural placed easily on first attempt.  No paresthesia.  Patient tolerated procedure well with no apparent complications.  Regina [name], MDReason for block:procedure for pain

## 2014-03-01 NOTE — Progress Notes (Signed)
   Subjective: Pt reports pain in upper abdomen with movement.  Objective: BP 95/58  Pulse 93  Temp(Src) 98.7 F (37.1 C) (Oral)  Resp 18  Ht 5' (1.524 m)  Wt 92.987 kg (205 lb)  BMI 40.04 kg/m2  SpO2 100%  LMP 05/12/2013      FHT:  FHR: 130's bpm, variability: moderate,  accelerations:  Present,  decelerations:  Absent UC:   Poor tracing on side > readjust SVE:   Dilation: 7.5 Effacement (%): 80 Station: 0;-1 Exam by:: Ace GinsL. Cresenzo, RN  Labs: Lab Results  Component Value Date   WBC 12.3* 03/01/2014   HGB 13.7 03/01/2014   HCT 41.0 03/01/2014   MCV 84.4 03/01/2014   PLT 201 03/01/2014    Assessment / Plan: Spontaneous labor, progressing normally  Labor: Progressing normally Preeclampsia:  n/a Fetal Wellbeing:  Category I Pain Control:  Epidural I/D:  GBS neg Anticipated MOD:  NSVD  Rochele PagesKARIM, Yulian Gosney N 03/01/2014, 4:23 PM

## 2014-03-01 NOTE — H&P (Signed)
LABOR ADMISSION HISTORY AND PHYSICAL  Regina [name] is a 28 y.o. female [phone] [id number] with IUP at 5948w6d by 8462w2d redating sono presenting with SOL. She reports +FMs, No LOF, no VB, no blurry vision, headaches or peripheral edema, and RUQ pain. She desires an epidural for labor pain control. She plans on breast feeding. She request mirena for birth control.  Previous c/s 2/2 "cord wrapped around neck", suspect 2/2 NRFHT, successful VBAC afterwards.  Dating: By 3862w2d --->  Estimated Date of Delivery: 03/02/14   [facility]  Dating 22 wk us  Genetic Screen Too late  Anatomic US WNL female  GTT Third trimester: 3896  TDaP vaccine 11/18/2013  Flu vaccine   GBS neg  Contraception  Mirena  Baby Food Breastfeed  Circumcision  NA  Pediatrician  [facility]  Support Person partner      Prenatal History/Complications:  Past Medical History: Past Medical History  Diagnosis Date  . Medical history non-contributory     Past Surgical History: Past Surgical History  Procedure Laterality Date  . Cesarean section      Obstetrical History: OB History   Grav Para Term Preterm Abortions TAB SAB Ect Mult Living   3 2 2  0 0 0 0 0 0 2       Social History: History   Social History  . Marital Status: Single    Spouse Name: N/A    Number of Children: N/A  . Years of Education: N/A   Social History Main Topics  . Smoking status: Never Smoker   . Smokeless tobacco: Never Used  . Alcohol Use: No  . Drug Use: No  . Sexual Activity: Yes    Birth Control/ Protection: None   Other Topics Concern  . None   Social History Narrative  . None    Family History: Family History  Problem Relation Age of Onset  . Heart disease Mother   . Hypertension Mother   . Diabetes Mother   . Hyperlipidemia Mother     Allergies: No Known Allergies  Prescriptions prior to admission  Medication Sig Dispense Refill  . Prenatal Vit-Fe Fumarate-FA (PRENATAL VITAMIN PO) Take by  mouth.         Review of Systems   All systems reviewed and negative except as stated in HPI  Blood pressure 91/50, pulse 68, temperature 98.3 F (36.8 C), temperature source Oral, resp. rate 18, last menstrual period 05/12/2013, SpO2 100.00%. General appearance: alert, cooperative and moderate distress with contractions Lungs: clear to auscultation bilaterally Heart: regular rate and rhythm Abdomen: soft, non-tender; bowel sounds normal Extremities: Homans sign is negative, no sign of DVT DTR's not examined Presentation: cephalic Fetal monitoringBaseline: 150 bpm, Variability: Good {> 6 bpm), Accelerations: no  and Decelerations: Absent Uterine activityq3-54min  Dilation: 4.5 Effacement (%): 70 Station: -2   Prenatal labs: ABO, Rh: O/POS/-- (05/20 1526) Antibody: NEG (05/20 1526) Rubella:   RPR: NON REAC (06/18 1012)  HBsAg: NEGATIVE (05/20 1526)  HIV: NONREACTIVE (06/18 1012)  GBS: Negative (09/04 0000)  1 hr Glucola 96 Genetic screening  Too late Anatomy US normal    Results for orders placed during the hospital encounter of 03/01/14 (from the past 24 hour(s))  CBC   Collection Time    03/01/14  9:08 AM      Result Value Ref Range   WBC 12.3 (*) 4.0 - 10.5 K/uL   RBC 4.86  3.87 - 5.11 MIL/uL   Hemoglobin 13.7  12.0 - 15.0  g/dL   HCT 98.1  19.1 - 47.8 %   MCV 84.4  78.0 - 100.0 fL   MCH 28.2  26.0 - 34.0 pg   MCHC 33.4  30.0 - 36.0 g/dL   RDW 29.5  62.1 - 30.8 %   Platelets 201  150 - 400 K/uL    Patient Active Problem List   Diagnosis Date Noted  . Active labor 03/01/2014  . Supervision of other normal pregnancy 11/18/2013  . Previous cesarean delivery, antepartum condition or complication 11/18/2013    Assessment: Regina [name] is a 28 y.o. G3P2002 at [redacted]w[redacted]d here for TOL after successful VBAC  #Labor:care with pitocin if needed for augmentation #Pain: epidural #FWB: Cat I, however no accels #ID:  GBS neg #MOF:  breast #MOC:mirena #Circ:  n/a  Regina [name] 03/01/2014, 10:46 AM

## 2014-03-01 NOTE — Progress Notes (Signed)
Subjective: Pt reports comfort with epidural  [facility] in [facility] room.  Objective: BP 114/66  Pulse 65  Temp(Src) 98.4 F (36.9 C) (Oral)  Resp 18  Ht 5' (1.524 m)  Wt 92.987 kg (205 lb)  BMI 40.04 kg/m2  SpO2 100%  LMP 05/12/2013      FHT:  FHR: 130's bpm, variability: moderate,  accelerations:  Present,  decelerations:  Absent UC:   irregular, every 2-5 minutes SVE:   Dilation: 5 Effacement (%): 80 Station: -1 Exam by:: e.foley,rn AROM > clear fluid  Labs: Lab Results  Component Value Date   WBC 12.3* 03/01/2014   HGB 13.7 03/01/2014   HCT 41.0 03/01/2014   MCV 84.4 03/01/2014   PLT 201 03/01/2014    Assessment / Plan: 28 yo G3P2002 at 2869w6d wks IUP Active Labor Category I FHR Tracing  Labor: Progressing normally Preeclampsia:  n/a Fetal Wellbeing:  Category I Pain Control:  Epidural I/D:  GBS neg Anticipated MOD:  NSVD  KARIM, WALIDAH N 03/01/2014, 12:18 PM

## 2014-03-01 NOTE — Anesthesia Preprocedure Evaluation (Signed)
Anesthesia Evaluation  Patient identified by MRN, date of birth, ID band Patient awake    Reviewed: Allergy & Precautions, H&P , NPO status , Patient's Chart, lab work & pertinent test results, reviewed documented beta blocker date and time   History of Anesthesia Complications Negative for: history of anesthetic complications  Airway Mallampati: III TM Distance: >3 FB Neck ROM: full    Dental  (+) Teeth Intact   Pulmonary neg pulmonary ROS,  breath sounds clear to auscultation        Cardiovascular negative cardio ROS  Rhythm:regular Rate:Normal     Neuro/Psych negative neurological ROS  negative psych ROS   GI/Hepatic negative GI ROS, Neg liver ROS,   Endo/Other  negative endocrine ROS  Renal/GU negative Renal ROS     Musculoskeletal   Abdominal   Peds  Hematology negative hematology ROS (+)   Anesthesia Other Findings   Reproductive/Obstetrics (+) Pregnancy (h/o C/S x1, h/o VBAC x1, attempting VBAC)                           Anesthesia Physical Anesthesia Plan  ASA: II  Anesthesia Plan: Epidural   Post-op Pain Management:    Induction:   Airway Management Planned:   Additional Equipment:   Intra-op Plan:   Post-operative Plan:   Informed Consent: I have reviewed the patients History and Physical, chart, labs and discussed the procedure including the risks, benefits and alternatives for the proposed anesthesia with the patient or authorized representative who has indicated his/her understanding and acceptance.     Plan Discussed with:   Anesthesia Plan Comments:         Anesthesia Quick Evaluation

## 2014-03-01 NOTE — Progress Notes (Signed)
Poor tracing on contractions.  IUPC placed, moderate amount of bleeding noted while placing IUPC.  FHR 130's, +accels, no decels.  Dr. Jolayne Pantheronstant notified and given update.

## 2014-03-01 NOTE — MAU Note (Signed)
Contractions started at 0230. No bleeding or leaking. No problems with contractions. Was closed when last checked.

## 2014-03-02 ENCOUNTER — Encounter: Payer: Self-pay | Admitting: Physician Assistant

## 2014-03-02 LAB — CBC
HEMATOCRIT: 34.7 % — AB (ref 36.0–46.0)
HEMOGLOBIN: 11.5 g/dL — AB (ref 12.0–15.0)
MCH: 28.1 pg (ref 26.0–34.0)
MCHC: 33.1 g/dL (ref 30.0–36.0)
MCV: 84.8 fL (ref 78.0–100.0)
Platelets: 179 10*3/uL (ref 150–400)
RBC: 4.09 MIL/uL (ref 3.87–5.11)
RDW: 14.3 % (ref 11.5–15.5)
WBC: 17.1 10*3/uL — ABNORMAL HIGH (ref 4.0–10.5)

## 2014-03-02 MED ORDER — PRENATAL MULTIVITAMIN CH
1.0000 | ORAL_TABLET | Freq: Every day | ORAL | Status: DC
  Administered 2014-03-02: 1 via ORAL
  Filled 2014-03-02: qty 1

## 2014-03-02 MED ORDER — SIMETHICONE 80 MG PO CHEW: 80.0000 mg | CHEWABLE_TABLET | ORAL | Status: DC | PRN

## 2014-03-02 MED ORDER — BENZOCAINE-MENTHOL 20-0.5 % EX AERO: 1.0000 "application " | INHALATION_SPRAY | CUTANEOUS | Status: DC | PRN

## 2014-03-02 MED ORDER — OXYCODONE-ACETAMINOPHEN 5-325 MG PO TABS: 1.0000 | ORAL_TABLET | ORAL | Status: DC | PRN

## 2014-03-02 MED ORDER — TETANUS-DIPHTH-ACELL PERTUSSIS 5-2.5-18.5 LF-MCG/0.5 IM SUSP: 0.5000 mL | Freq: Once | INTRAMUSCULAR | Status: DC

## 2014-03-02 MED ORDER — DIBUCAINE 1 % RE OINT: 1.0000 "application " | TOPICAL_OINTMENT | RECTAL | Status: DC | PRN

## 2014-03-02 MED ORDER — IBUPROFEN 600 MG PO TABS: 600.0000 mg | ORAL_TABLET | Freq: Four times a day (QID) | ORAL | Status: DC | PRN

## 2014-03-02 MED ORDER — OXYCODONE-ACETAMINOPHEN 5-325 MG PO TABS: 2.0000 | ORAL_TABLET | ORAL | Status: DC | PRN

## 2014-03-02 MED ORDER — WITCH HAZEL-GLYCERIN EX PADS: 1.0000 "application " | MEDICATED_PAD | CUTANEOUS | Status: DC | PRN

## 2014-03-02 MED ORDER — SENNOSIDES-DOCUSATE SODIUM 8.6-50 MG PO TABS
2.0000 | ORAL_TABLET | ORAL | Status: DC
  Administered 2014-03-02: 2 via ORAL
  Filled 2014-03-02: qty 2

## 2014-03-02 MED ORDER — INFLUENZA VAC SPLIT QUAD 0.5 ML IM SUSY
0.5000 mL | PREFILLED_SYRINGE | INTRAMUSCULAR | Status: AC
  Administered 2014-03-02: 0.5 mL via INTRAMUSCULAR
  Filled 2014-03-02: qty 0.5

## 2014-03-02 MED ORDER — ZOLPIDEM TARTRATE 5 MG PO TABS: 5.0000 mg | ORAL_TABLET | Freq: Every evening | ORAL | Status: DC | PRN

## 2014-03-02 MED ORDER — IBUPROFEN 600 MG PO TABS
600.0000 mg | ORAL_TABLET | Freq: Four times a day (QID) | ORAL | Status: DC
  Administered 2014-03-02 (×3): 600 mg via ORAL
  Filled 2014-03-02 (×4): qty 1

## 2014-03-02 MED ORDER — ONDANSETRON HCL 4 MG/2ML IJ SOLN: 4.0000 mg | INTRAMUSCULAR | Status: DC | PRN

## 2014-03-02 MED ORDER — DIPHENHYDRAMINE HCL 25 MG PO CAPS: 25.0000 mg | ORAL_CAPSULE | Freq: Four times a day (QID) | ORAL | Status: DC | PRN

## 2014-03-02 MED ORDER — LANOLIN HYDROUS EX OINT: TOPICAL_OINTMENT | CUTANEOUS | Status: DC | PRN

## 2014-03-02 MED ORDER — ONDANSETRON HCL 4 MG PO TABS: 4.0000 mg | ORAL_TABLET | ORAL | Status: DC | PRN

## 2014-03-02 NOTE — Lactation Note (Addendum)
This note was copied from the chart of Regina Armine Marcheta GrammesHerrera [name]. Lactation Consultation Note Initial visit at 27 hours of age with spanish interpreter and MBU RN at bedside.   This is mom's third baby and has over 3 years of experience.  Mom denies pain or concerns.  [facility] given and discussed. Encouraged to feed 8-12/in 24 hours.    Mom has done hand expression and reports colostrum visible.  Discharge discussed as mom is being discharged now.     Patient Name: Regina [name] ZOXWR'UToday's Date: 03/02/2014 Reason for consult: Initial assessment   Maternal Data Has patient been taught Hand Expression?: Yes Does the patient have breastfeeding experience prior to this delivery?: Yes  Feeding Feeding Type: Breast Fed Length of feed: 36 min  LATCH Score/Interventions                Intervention(s): Breastfeeding basics reviewed;Support Pillows     Lactation Tools Discussed/Used WIC Program: No   Consult Status Consult Status: Complete    Regina [name], Regina [name] 03/02/2014, 9:57 PM

## 2014-03-02 NOTE — Progress Notes (Signed)
Clinical Social Work Department PSYCHOSOCIAL ASSESSMENT - MATERNAL/CHILD 03/02/2014  Patient:  Regina [name],Regina [name]  Account Number:  [id number]  Admit Date:  03/01/2014  Clinical Social Worker:  Delina Kruczek, CLINICAL SOCIAL WORKER   Date/Time:  03/02/2014 01:45 PM  Date Referred:  03/02/2014   Referral source  Physician     Referred reason  Other - See comment   Other referral source:   MD concerned about limited range in affect.    I:  FAMILY / HOME ENVIRONMENT Child's legal guardian:  PARENT  Guardian - Name Guardian - Age Guardian - Address  Minh [name] Regina [name] 28 [address] [place], [place] [address]  Jose  same as above   Other household support members/support persons Name Relationship DOB   SON 2009   DAUGHTER 2011   Other support:   MOB stated that their neighbor is helping with childcare while they are at the hospital.  MOB shared that she also has a sibling that lives in town who is supportive.    II  PSYCHOSOCIAL DATA Information Source:  Family Interview  Financial and Community Resources Employment:   MOB stated that she and the FOB are currently unemployed.   Financial resources:  Self Pay If Medicaid - County:  [place]  School / Grade:  N/A Maternity Care Coordinator / Child Services Coordination / Early Interventions:   N/A  Cultural issues impacting care:   Family is Spanish-speaking.    III  STRENGTHS Strengths  Home prepared for Child (including basic supplies)  Supportive family/friends  Adequate Resources   Strength comment:    IV  RISK FACTORS AND CURRENT PROBLEMS Current Problem:  YES   Risk Factor & Current Problem Patient Issue Family Issue Risk Factor / Current Problem Comment  Mental Illness Y N MOB reported history of postpartum depression following the birth of her two other children.  Financial Resources Y N MOB currently does not have Medicaid and is concerned about access to medical providers without  Medicaid.  MOB also expressed concerns since both she and the FOB are currently not working.    V  SOCIAL WORK ASSESSMENT CSW met with the MOB and the FOB in their room to complete the assessment. Consult was ordered due to MD observing that MOB had a flat affect.  Prior to meeting with the family, RN shared that MOB does smile occasionally and is able to display a full range in affect.  MOB and FOB were receptive to CSW visit, and were primarily focused on basic needs versus processing feelings.  MOB did display full range in affect while CSW was in the room, she expressed excitement upon the birth of her daughter, and was observed to be bonding with her baby.  MOB thanked CSW for the visit.    MOB shared that she is excited for the new addition in their family.  MOB discussed perceptions that she has some supports, including her sibling and their neighbor.  Per MOB, the home is prepared for the baby, and that they have secured all necessary items.  MOB and FOB began to discuss some stressors related to them currently being unemployed.  CSW validated the stress that accompanies unemployment and a baby being born.  MOB asked numerous questions related to Medicaid.  Per FOB,  Care management is expected to visit prior to discharge to assist them with the application. MOB asked numerous questions about Medicaid and what Medicaid will cover.  CSW noted that MOB may be presenting   as anxious/worried about fear that she will have a large hospital bill once discharged.  MOB presented as less anxious as CSW discussed ability for the baby to receive follow-up appointments even when Medicaid application is pending.    MOB reported history of postpartum depression following the birth of her children.  She denied symptoms during this pregnancy, and shared that she is currently feeling "good" as she begins the transition to postpartum.  MOB aware of increased risk of developing postpartum depression due to her history.   She is agreeable to following up with her MD if she experiences symptoms.  No barriers to discharge.    VI SOCIAL WORK PLAN Social Work Plan  Patient/Family Education  No Further Intervention Required / No Barriers to Discharge   Type of pt/family education:   Postpartum depression   If child protective services report - county:   If child protective services report - date:   Information/referral to community resources comment:   MOB to meet with care management prior to discharge in order to receive help applying for Medicaid.   Other social work plan:   CSW recommended to MOB to go to doctor if she experiences symptoms of postpartum depression.  CSW to provide ongoing support PRN.     

## 2014-03-02 NOTE — H&P (Signed)
Attestation of Attending Supervision of Advanced Practitioner (CNM/NP): Evaluation and management procedures were performed by the Advanced Practitioner under my supervision and collaboration.  I have reviewed the Advanced Practitioner's note and chart, and I agree with the management and plan.  Donye Dauenhauer 03/02/2014 8:04 AM   

## 2014-03-02 NOTE — Discharge Instructions (Signed)
Parto vaginal, Cuidados posteriores  °(Vaginal Delivery, Care After) °Siga estas instrucciones durante las próximas semanas. Estas indicaciones para el alta le proporcionan información general acerca de cómo deberá cuidarse después del parto. El médico también podrá darle instrucciones específicas. El tratamiento ha sido planificado según las prácticas médicas actuales, pero en algunos casos pueden ocurrir problemas. Comuníquese con el médico si tiene algún problema o tiene preguntas al volver a su casa.  °INSTRUCCIONES PARA EL CUIDADO EN EL HOGAR  °· Tome sólo medicamentos de venta libre o recetados, según las indicaciones del médico o del farmacéutico. °· No beba alcohol, especialmente si está amamantando o toma analgésicos. °· No mastique tabaco ni fume. °· No consuma drogas. °· Continúe con un adecuado cuidado perineal. El buen cuidado perineal incluye: °¨ Higienizarse de adelante hacia atrás. °¨ Mantener la zona perineal limpia. °· No use tampones ni duchas vaginales hasta que su médico la autorice. °· Dúchese, lávese el cabello y tome baños de inmersión según las indicaciones de su médico. °· Utilice un sostén que le ajuste bien y que brinde buen soporte a sus mamas. °· Consuma alimentos saludables. °· Beba suficiente líquido para mantener la orina clara o de color amarillo pálido. °· Consuma alimentos ricos en fibra como cereales y panes integrales, arroz, frijoles y frutas y verduras frescas todos los días. Estos alimentos pueden ayudarla a prevenir o aliviar el estreñimiento. °· Siga las recomendaciones de su médico relacionadas con la reanudación de actividades como subir escaleras, conducir automóviles, levantar objetos, hacer ejercicios o viajar. °· Hable con su médico acerca de reanudar la actividad sexual. Volver a la actividad sexual depende del riesgo de infección, la velocidad de la curación y la comodidad y su deseo de reanudarla. °· Trate de que alguien la ayude con las actividades del hogar y con  el recién nacido al menos durante un par de días después de salir del hospital. °· Descanse todo lo que pueda. Trate de descansar o tomar una siesta mientras el bebé está durmiendo. °· Aumente sus actividades gradualmente. °· Cumpla con todas las visitas de control programadas para después del parto. Es muy importante asistir a todas las citas programadas de seguimiento. En estas citas, su médico va a controlarla para asegurarse de que esté sanando física y emocionalmente. °SOLICITE ATENCIÓN MÉDICA SI:  °· Elimina coágulos grandes por la vagina. Guarde algunos coágulos para mostrarle al médico. °· Tiene una secreción con feo olor que proviene de la vagina. °· Tiene dificultad para orinar. °· Orina con frecuencia. °· Siente dolor al orinar. °· Nota un cambio en sus movimientos intestinales. °· Aumenta el enrojecimiento, el dolor o la hinchazón en la zona de la incisión vaginal (episiotomía) o el desgarro vaginal. °· Tiene pus que drena por la episiotomía o el desgarro vaginal. °· La episiotomía o el desgarro vaginal se abren. °· Sus mamas le duelen, están duras o enrojecidas. °· Sufre un dolor intenso de cabeza. °· Tiene visión borrosa o ve manchas. °· Se siente triste o deprimida. °· Tiene pensamientos acerca de lastimarse o dañar al recién nacido. °· Tiene preguntas acerca de su cuidado personal, el cuidado del recién nacido o acerca de los medicamentos. °· Se siente mareada o sufre un desmayo. °· Tiene una erupción. °· Tiene náuseas o vómitos. °· Usted amamantó al bebé y no ha tenido su período menstrual dentro de las 12 semanas después de dejar de amamantar. °· No amamanta al bebé y no tuvo su período menstrual en las últimas 12° semanas después del   partoLance Muss.  Tiene fiebre. SOLICITE ATENCIN MDICA DE INMEDIATO SI:   Siente dolor persistente.  Siente dolor en el pecho.  Le falta el aire.  Se desmaya.  Siente dolor en la pierna.  Siente Physiological scientistdolor en el estmago.  El sangrado vaginal satura dos o ms  apsitos en 1 hora. ASEGRESE DE QUE:   Comprende estas instrucciones.  Controlar su enfermedad.  Recibir ayuda de inmediato si no mejora o si empeora. Document Released: 05/20/2005 Document Revised: 01/20/2013 [facility]ExitCare Patient Information 2015 [place]ExitCare, [place]LL[place]. This information is not intended to replace advice given to you by your health care provider. Make sure you discuss any questions you have with your health care provider.  Antes de que el beb llegue a casa (Before Hewlett-PackardBaby Comes Home) Estas son algunas cosas que usted debe tener antes que su beb llegue a casa. Pregunte nuevamente si no comprende. Pregunte cundo debe ver al mdico nuevamente.  Asiento infantil para el auto (exigido por ley).  Camas separadas.  Si no tiene una cama para el beb o [place] cuna, puede hacerla con un cajn de Neomia Dearuna cmoda, una caja, cartn, cesta para la ropa, etc.  No deje que el beb duerma en una cama con usted u otra persona. Insumos para la alimentacin infantil:  6 a 8 botellas (8 onzas).  6 a 8 tetinas.  Jarra medidora.  Cuchara medidora.  Cepillo para limpiar botellas.  Corporate treasurersterilizador (o use cualquier olla o sartn grande con tapa).  Leche maternizada que contiene hierro.  Elementos para hervir y Financial traderenfriar el agua. Insumos de Tour managerlactancia materna.  Sacaleche.  Crema para los pezones. Ropa:  24 a 36 paales y cubrepaales de plstico y Neomia Dearuna caja de paales desechables. Usted puede necesitar tanto como 10 a 12 paales por Futures traderda.  3 camisas (otras prendas depender de la poca del ao y el clima).  3 mantillas.  3 pijamas o camisn de beb .  3 baberos. Equipamiento de bao:  Jabn suave.  Vaselina. No use aceite o talco para el beb .  Toalla de tela suave y pao para lavarlo.  Pompones de algodn.  Fuente de bao especial para el beb. Slo bae al beb con Neomia Dearuna esponja hasta que el cordn umbilical y la circuncisin hayan curado. Otros suministros:  Un termmetro y [place]  pera de goma que sern entregados para que lleve a su casa cuando el beb salga del hospital. Pregunte al mdico cmo debe tomar la temperatura del beb.  1  2 chupetes. Preprese para una emergencia:   Sepa cmo llegar al hospital y sepa en qu lugar admiten al beb.  Rena todos los nmeros telefnicos de los proveedores cerca del telfono de la casa y en su celular, si tiene [place]. Prepare a su familia:   Hable con sus hijos acerca del nuevo beb y pregunte qu piensan al respecto.  Decida cmo desea manejar las visitas y a los otros miembros de la familia.  Acepte ofertas de ayuda con el beb. Necesitar tiempo para adaptarse. Sepa cundo debe llamar al mdico:  SOLICITE AYUDA DE INMEDIATO SI:   Presenta una temperatura mayor a 100.4 F (38 C).  Abultamiento de las fontanelas en la cabeza.  Sntomas de deshidratacin como llanto sin lgrimas o ausencia de paales mojados luego de 6 horas.  Respiracin rpida.  Disminuye el estado de Lillianalerta. Document Released: 08/16/2008 Document Revised: 08/12/2011 [facility] Patient Information 2015 [place]ExitCare, [place]LLC. This information is not intended to replace advice given to you by your health care  provider. Make sure you discuss any questions you have with your health care provider. ° °

## 2014-03-02 NOTE — Anesthesia Postprocedure Evaluation (Signed)
Anesthesia Post Note  Patient: Lua Georga KaufmannVanessa Herrera Alfaro  Procedure(s) Performed: * No procedures listed *  Anesthesia type: Epidural  Patient location: Mother/Baby  Post pain: Pain level controlled  Post assessment: Post-op Vital signs reviewed  Last Vitals:  Filed Vitals:   03/02/14 0230  BP: 106/65  Pulse: 79  Temp: 36.9 C  Resp: 18    Post vital signs: Reviewed  Level of consciousness:alert  Complications: No apparent anesthesia complications

## 2014-03-02 NOTE — Discharge Summary (Signed)
Attestation of Attending Supervision of Obstetric Fellow: Evaluation and management procedures were performed by the Obstetric Fellow under my supervision and collaboration.  I have reviewed the Obstetric Fellow's note and chart, and I agree with the management and plan.  UGONNA  ANYANWU, MD, FACOG Attending Obstetrician & Gynecologist Faculty Practice, [facility]   

## 2014-03-02 NOTE — Progress Notes (Signed)
I assisted [place] RN with some questions. I order patients breakfast By Orlan LeavensViria Alvarez

## 2014-03-02 NOTE — Progress Notes (Signed)
UR chart review completed.  

## 2014-03-02 NOTE — Discharge Summary (Signed)
Obstetric Discharge Summary Reason for Admission: onset of labor Prenatal Procedures: none Intrapartum Procedures: spontaneous vaginal delivery Postpartum Procedures: none Complications-Operative and Postpartum: none  Delivery Note At 6:45 PM a viable female was delivered via Vaginal, Spontaneous Delivery (Presentation: Left Occiput Anterior).  APGAR: 8, 9; weight 7 lb 12.3 oz (3524 g).   Placenta status: Intact, Spontaneous.  Cord: 3 vessels with the following complications: None.   Anesthesia: Epidural  Episiotomy: None Lacerations: None Suture Repair: n/a Est. Blood Loss (mL): 400  Mom to postpartum.  Baby to Couplet care / Skin to Skin.  Regina [name] 03/02/2014, 8:59 AM     Hospital Course:  Active Problems:   Active labor   Today: No acute events overnight.  Pt denies problems with ambulating, voiding or po intake.  She denies nausea or vomiting.  Pain is well controlled.  Lochia Minimal.  Plan for birth control is  IUD.  Method of Feeding: breast  Regina [name] is a 28 y.o. [phone] s/p VBAC.  Patient presented to [facility] in active labor and was admitted to L&D.  She has postpartum course that was uncomplicated including no problems with ambulating, PO intake, urination, pain, or bleeding. The pt feels ready to go home and  will be discharged with outpatient follow-up.    H/H: Lab Results  Component Value Date/Time   HGB 11.5* 03/02/2014  6:00 AM   HCT 34.7* 03/02/2014  6:00 AM    Discharge Diagnoses: Term Pregnancy-delivered  Discharge Information: Date: 03/02/2014 Activity: pelvic rest Diet: routine  Medications: None Breast feeding:  Yes Condition: stable Instructions: refer to handout Discharge to: home      Medication List    ASK your doctor about these medications       PRENATAL VITAMIN PO  Take by mouth.         Perry MountACOSTA,Lacinda Curvin [name] ,MD OB Fellow 03/02/2014,8:59 AM

## 2014-04-04 ENCOUNTER — Encounter (HOSPITAL_COMMUNITY): Payer: Self-pay | Admitting: *Deleted

## 2014-04-11 ENCOUNTER — Encounter: Payer: Self-pay | Admitting: Obstetrics & Gynecology

## 2014-04-11 ENCOUNTER — Ambulatory Visit (INDEPENDENT_AMBULATORY_CARE_PROVIDER_SITE_OTHER): Payer: Medicaid Other | Admitting: Obstetrics & Gynecology

## 2014-04-11 DIAGNOSIS — O34219 Maternal care for unspecified type scar from previous cesarean delivery: Secondary | ICD-10-CM

## 2014-04-11 DIAGNOSIS — O3421 Maternal care for scar from previous cesarean delivery: Secondary | ICD-10-CM

## 2014-04-11 DIAGNOSIS — Z30018 Encounter for initial prescription of other contraceptives: Secondary | ICD-10-CM

## 2014-04-11 DIAGNOSIS — Z30017 Encounter for initial prescription of implantable subdermal contraceptive: Secondary | ICD-10-CM

## 2014-04-11 DIAGNOSIS — Z308 Encounter for other contraceptive management: Secondary | ICD-10-CM

## 2014-04-11 LAB — POCT PREGNANCY, URINE: Preg Test, Ur: NEGATIVE

## 2014-04-11 MED ORDER — ETONOGESTREL 68 MG ~~LOC~~ IMPL
68.0000 mg | DRUG_IMPLANT | Freq: Once | SUBCUTANEOUS | Status: AC
  Administered 2014-04-11: 68 mg via SUBCUTANEOUS

## 2014-04-11 NOTE — Patient Instructions (Signed)
Regrese a la clinica cuando tenga su cita. Si tiene problemas o preguntas, llama a la clinica o vaya a la sala [facility] al [facility].    

## 2014-04-11 NOTE — Progress Notes (Signed)
    Subjective:     Regina [name] is a 28 y.o. [phone] female who presents for a postpartum visit. She is 6 weeks postpartum following a VBAC (2nd successful one). I have fully reviewed the prenatal and intrapartum course. The delivery was at 39.6 gestational weeks. Anesthesia: epidural. Postpartum course has been uncomplicated. Baby's course has been uncomplicated. Baby is feeding by both breast and bottle. Bleeding: no bleeding. Bowel function is normal. Bladder function is normal. Patient is not sexually active. Contraception method is Nexplanon. Postpartum depression screening: negative.  The following portions of the patient's history were reviewed and updated as appropriate: allergies, current medications, past family history, past medical history, past social history, past surgical history and problem list. Normal pap and negative HRHPV on 11/18/13.  Review of Systems Pertinent items are noted in HPI.   Objective:    BP 104/79 mmHg  Pulse 68  Temp(Src) 97.3 F (36.3 C)  Wt 195 lb (88.451 kg)  Breastfeeding? Yes  General:  alert and no distress   Breasts:  inspection negative, no nipple discharge or bleeding, no masses or nodularity palpable  Lungs: clear to auscultation bilaterally  Heart:  regular rate and rhythm  Abdomen: soft, non-tender; bowel sounds normal; no masses,  no organomegaly  Pelvic:  deferred   Nexplanon Insertion Procedure Patient was given informed consent, she signed consent form.  Patient does understand that irregular bleeding is a very common side effect of this medication. She was advised to have backup contraception for one week after placement. Pregnancy test in clinic today was negative.  Appropriate time out taken.  Patient's left arm was prepped and draped in the usual sterile fashion.. The ruler used to measure and mark insertion area.  Patient was prepped with alcohol swab and then injected with 3 ml of 1% lidocaine.  She was prepped with  betadine, Nexplanon removed from packaging,  Device confirmed in needle, then inserted full length of needle and withdrawn per handbook instructions. Nexplanon was able to palpated in the patient's arm; patient palpated the insert herself. There was minimal blood loss.  Patient insertion site covered with guaze and a pressure bandage to reduce any bruising.  The patient tolerated the procedure well and was given post procedure instructions.    Assessment:   Normal postpartum exam. Pap smear not done at today's visit.  Nexplanon placed today.  Plan:   1. Contraception: Nexplanon placed today 2. Follow up as needed.     Regina CollinsUGONNA  Merek Niu, MD, FACOG Attending Obstetrician & Gynecologist [facility], [facility]

## 2014-04-11 NOTE — Progress Notes (Signed)
Patient ID: Regina [name], female   DOB: 04/20/1986, 28 y.o.   MRN: [id number] Regina [name] Spanish Interpreter present

## 2014-04-18 ENCOUNTER — Ambulatory Visit: Payer: Self-pay | Admitting: Family Medicine

## 2014-05-20 ENCOUNTER — Encounter: Payer: Self-pay | Admitting: *Deleted

## 2016-02-22 IMAGING — US US OB COMP +14 WK
1 series · 12 of 28 positions shown · non-contrast
Comparison: none

[Series 1: us ob detail +14 wk · 80 acquisitions, 12 frames shown]
[im 3/80]
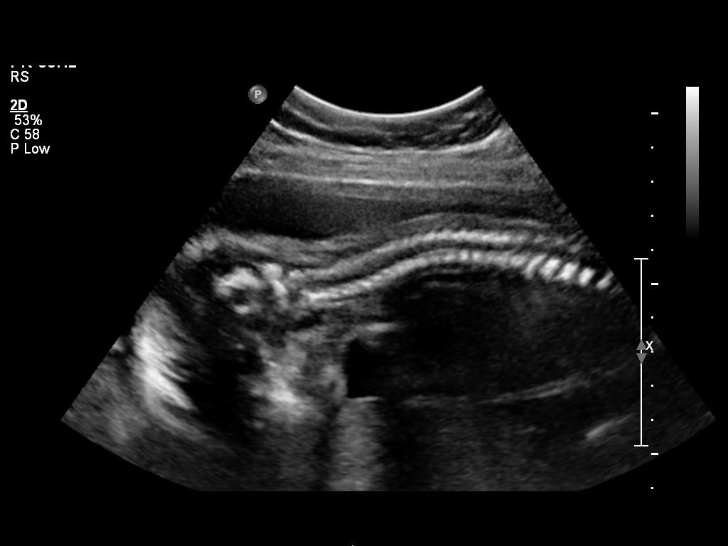
[im 9/80]
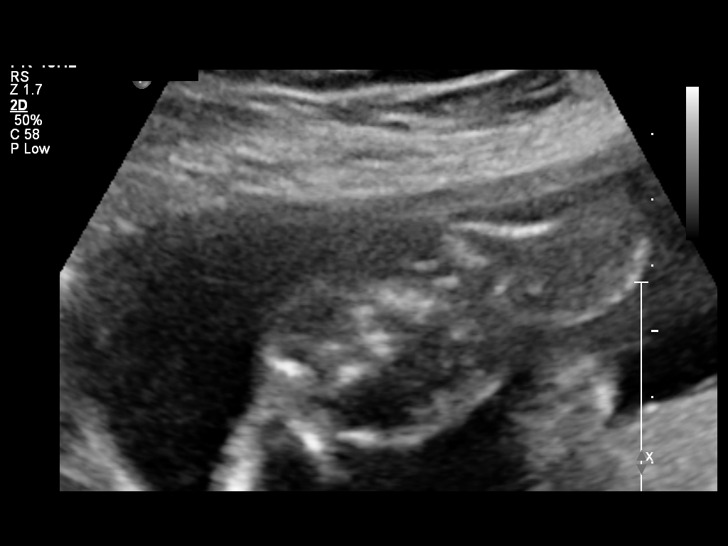
[im 15/80]
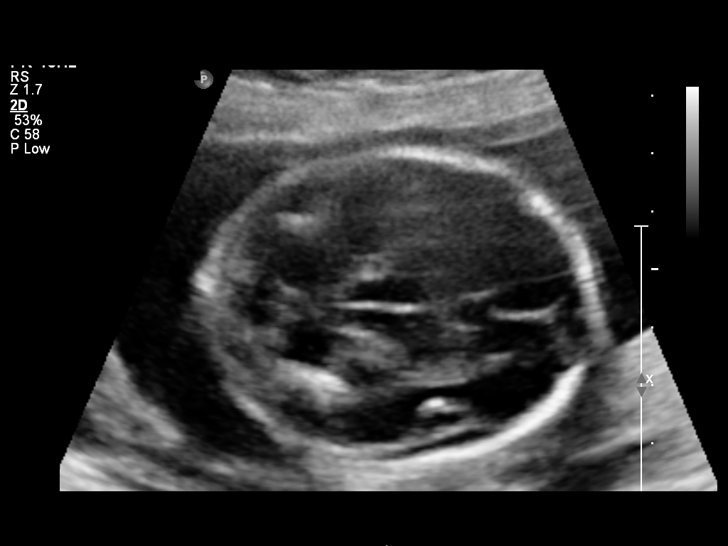
[im 24/80]
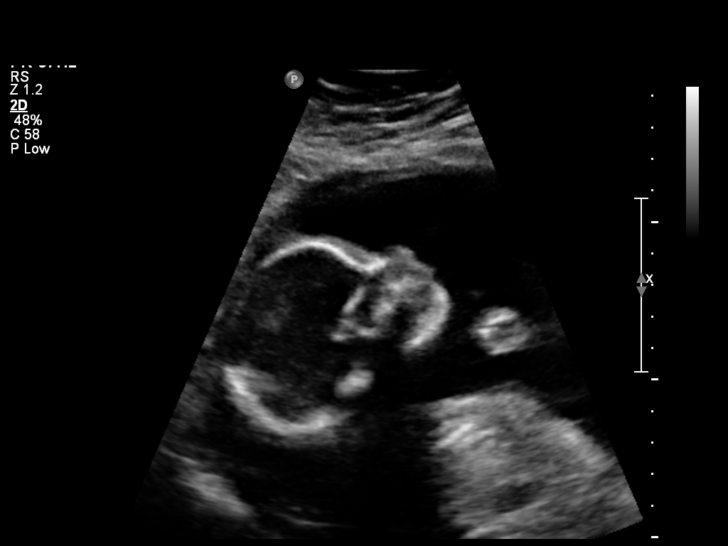
[im 30/80]
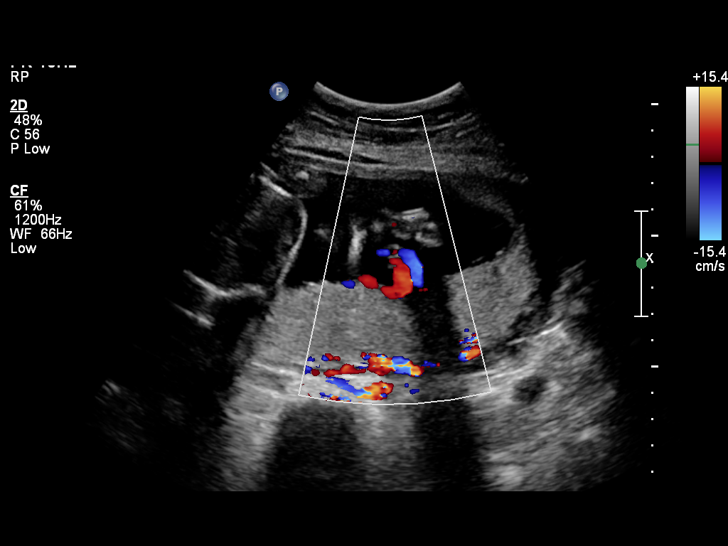
[im 36/80]
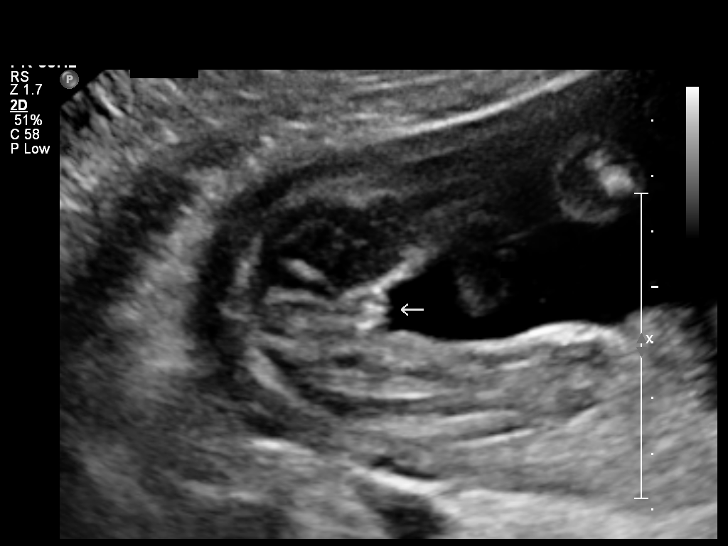
[im 44/80]
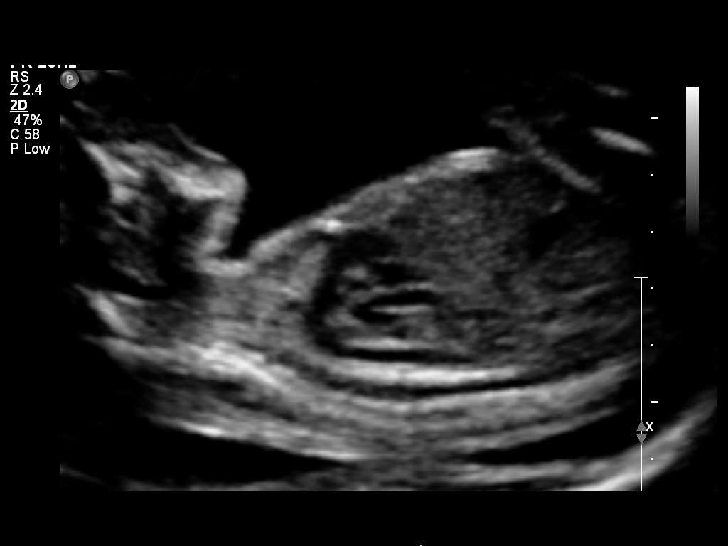
[im 50/80]
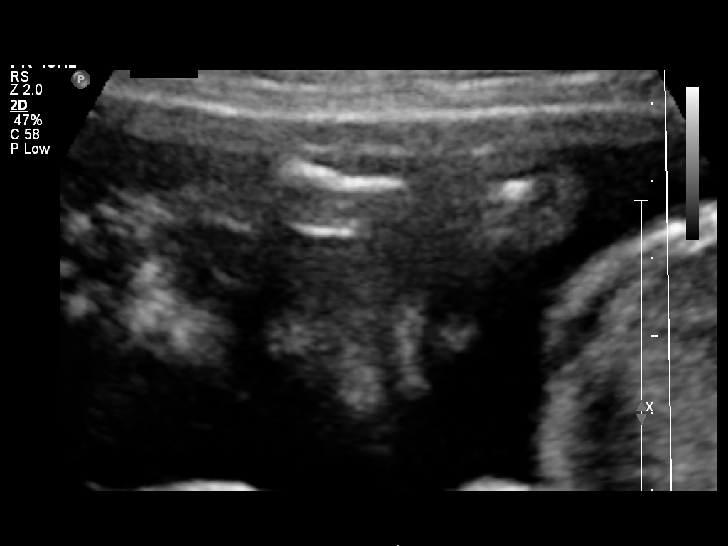
[im 56/80]
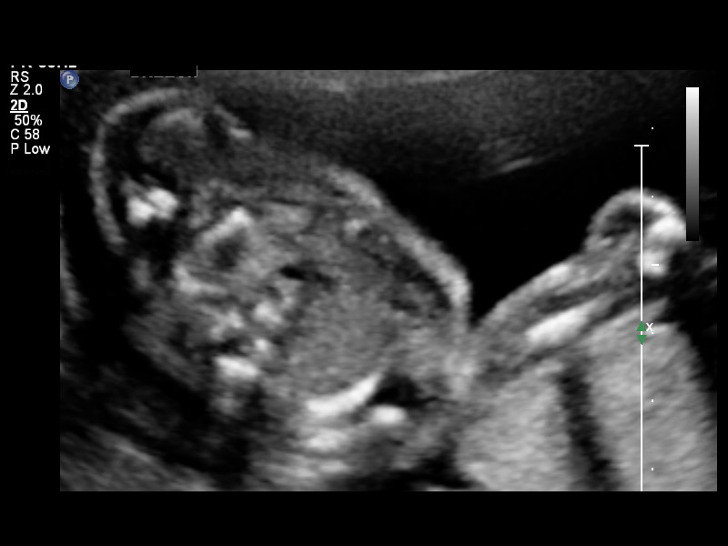
[im 65/80]
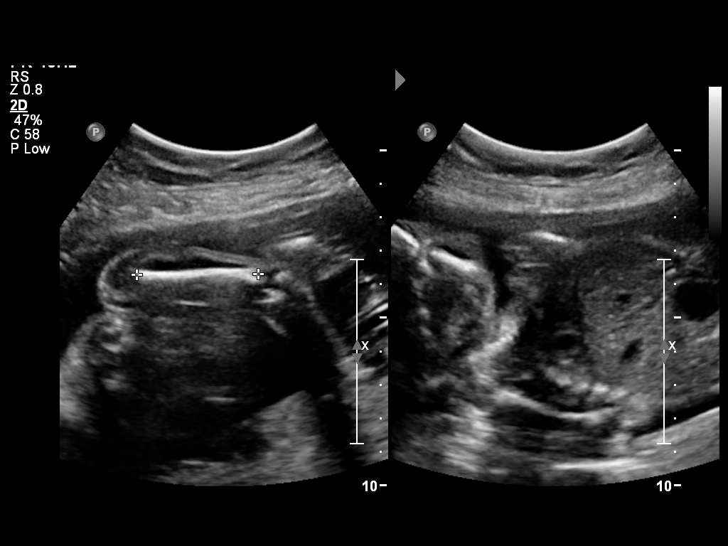
[im 71/80]
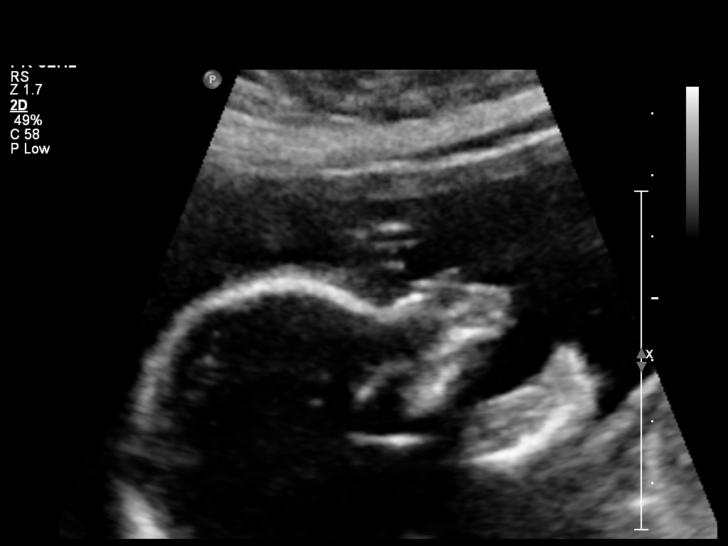
[im 77/80]
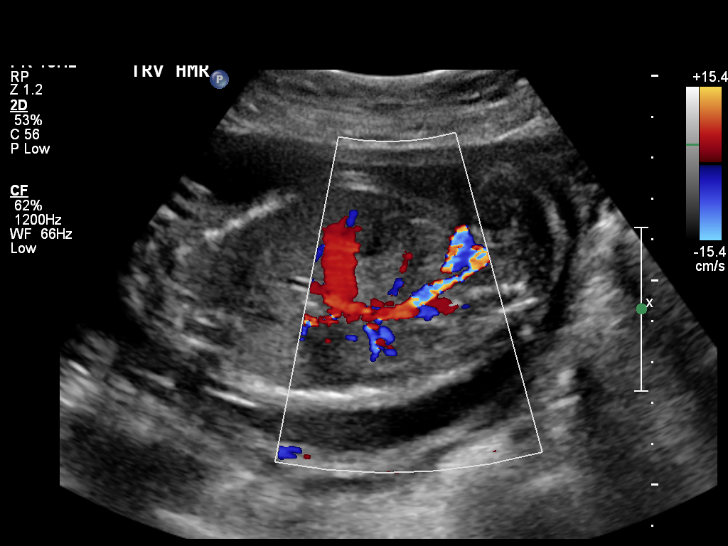

[12 of 28 positions shown; findings below may reference images not displayed]

OBSTETRICS REPORT
                      (Signed Final 10/29/2013 [DATE])

             SUYAPA

Service(s) Provided

 US OB COMP + 14 WK                                    76805.1
Indications

 Basic anatomic survey
 Unsure of LMP;  Establish Gestational [AGE]
 Previous cesarean section
 Poor obstetric history: Previous gestational
 diabetes
 Poor obstetric history: Previous gestational HTN
Fetal Evaluation

 Num Of Fetuses:    1
 Fetal Heart Rate:  138                          bpm
 Cardiac Activity:  Observed
 Presentation:      Variable
 Placenta:          Posterior, above cervical
                    os
 P. Cord            Visualized, central
 Insertion:

 Amniotic Fluid
 AFI FV:      Subjectively within normal limits
                                             Larg Pckt:    6.16  cm
Biometry

 BPD:     54.1  mm     G. Age:  22w 3d                CI:        74.73   70 - 86
                                                      FL/HC:      18.9   18.4 -

 HC:     198.6  mm     G. Age:  22w 0d       28  %    HC/AC:      1.10   1.06 -

 AC:     180.1  mm     G. Age:  22w 6d       61  %    FL/BPD:     69.5   71 - 87
 FL:      37.6  mm     G. Age:  22w 0d       30  %    FL/AC:      20.9   20 - 24
 HUM:       36  mm     G. Age:  22w 4d       51  %
 CER:     23.4  mm     G. Age:  21w 5d       37  %

 Est. FW:     504  gm      1 lb 2 oz     52  %
Gestational Age

 LMP:           24w 1d        Date:  05/13/13                 EDD:   02/17/14
 U/S Today:     22w 2d                                        EDD:   03/02/14
 Best:          22w 2d     Det. By:  U/S (10/29/13)           EDD:   03/02/14
Anatomy

 Cranium:          Appears normal         Aortic Arch:      Appears normal
 Fetal Cavum:      Appears normal         Ductal Arch:      Appears normal
 Ventricles:       Appears normal         Diaphragm:        Appears normal
 Choroid Plexus:   Appears normal         Stomach:          Appears normal, left
                                                            sided
 Cerebellum:       Appears normal         Abdomen:          Appears normal
 Posterior Fossa:  Appears normal         Abdominal Wall:   Appears nml (cord
                                                            insert, abd wall)
 Nuchal Fold:      Appears normal         Cord Vessels:     Appears normal (3
                                                            vessel cord)
 Face:             Appears normal         Kidneys:          Appear normal
                   (orbits and profile)
 Lips:             Appears normal         Bladder:          Appears normal
 Heart:            Appears normal         Spine:            Appears normal
                   (4CH, axis, and
                   situs)
 RVOT:             Appears normal         Lower             Appears normal
                                          Extremities:
 LVOT:             Appears normal         Upper             Appears normal
                                          Extremities:

 Other:  Fetus appears to be a female. Heels and 5th digit visualized. Nasal
         bone visualized.
Targeted Anatomy

 Fetal Central Nervous System
 Cisterna Magna:
Cervix Uterus Adnexa

 Cervical Length:    3.11     cm

 Cervix:       Normal appearance by transabdominal scan.
 Uterus:       No abnormality visualized.
 Cul De Sac:   No free fluid seen.
 Left Ovary:    Not visualized.
 Right Ovary:   Within normal limits.

 Adnexa:     No abnormality visualized.
Impression

 SIUP at 22+2 weeks
 Normal detailed fetal anatomy
 Normal amniotic fluid volume
 EDC based on today's measurements
Recommendations

 Follow-up as clinically indicated

 MARIA FELIX TORES with us.  Please do not hesitate to

## 2017-10-22 ENCOUNTER — Encounter (HOSPITAL_COMMUNITY): Payer: Self-pay

## 2017-10-22 ENCOUNTER — Emergency Department (HOSPITAL_COMMUNITY)
Admission: EM | Admit: 2017-10-22 | Discharge: 2017-10-23 | Disposition: A | Discharge status: Home / Self Care | Payer: Self-pay | Attending: Emergency Medicine | Admitting: Emergency Medicine

## 2017-10-22 DIAGNOSIS — R109 Unspecified abdominal pain: Secondary | ICD-10-CM

## 2017-10-22 DIAGNOSIS — R1010 Upper abdominal pain, unspecified: Secondary | ICD-10-CM | POA: Insufficient documentation

## 2017-10-22 LAB — CBC
HCT: 41.1 % (ref 36.0–46.0)
HEMOGLOBIN: 13.2 g/dL (ref 12.0–15.0)
MCH: 27.1 pg (ref 26.0–34.0)
MCHC: 32.1 g/dL (ref 30.0–36.0)
MCV: 84.4 fL (ref 78.0–100.0)
Platelets: 246 10*3/uL (ref 150–400)
RBC: 4.87 MIL/uL (ref 3.87–5.11)
RDW: 12.8 % (ref 11.5–15.5)
WBC: 12.9 10*3/uL — ABNORMAL HIGH (ref 4.0–10.5)

## 2017-10-22 LAB — URINALYSIS, ROUTINE W REFLEX MICROSCOPIC
Bilirubin Urine: NEGATIVE
Glucose, UA: NEGATIVE mg/dL
Ketones, ur: NEGATIVE mg/dL
Nitrite: NEGATIVE
Protein, ur: NEGATIVE mg/dL
Specific Gravity, Urine: 1.018 (ref 1.005–1.030)
pH: 5 (ref 5.0–8.0)

## 2017-10-22 LAB — COMPREHENSIVE METABOLIC PANEL
ALT: 16 U/L (ref 14–54)
AST: 22 U/L (ref 15–41)
Albumin: 3.6 g/dL (ref 3.5–5.0)
Alkaline Phosphatase: 76 U/L (ref 38–126)
Anion gap: 11 (ref 5–15)
BUN: 10 mg/dL (ref 6–20)
CO2: 27 mmol/L (ref 22–32)
Calcium: 9.1 mg/dL (ref 8.9–10.3)
Chloride: 100 mmol/L — ABNORMAL LOW (ref 101–111)
Creatinine, Ser: 0.98 mg/dL (ref 0.44–1.00)
GFR calc Af Amer: >60 mL/min (ref >60)
GFR calc non Af Amer: >60 mL/min (ref >60)
Glucose, Bld: 126 mg/dL — ABNORMAL HIGH (ref 65–99)
Potassium: 3.5 mmol/L (ref 3.5–5.1)
Sodium: 138 mmol/L (ref 135–145)
Total Bilirubin: 0.5 mg/dL (ref 0.3–1.2)
Total Protein: 7 g/dL (ref 6.5–8.1)

## 2017-10-22 LAB — I-STAT BETA HCG BLOOD, ED (MC, WL, AP ONLY): I-stat hCG, quantitative: <5 m[IU]/mL (ref <5)

## 2017-10-22 LAB — LIPASE, BLOOD: Lipase: 26 U/L (ref 11–51)

## 2017-10-22 NOTE — ED Triage Notes (Signed)
Pt states that this evening she began to have generalized abd pain that started on the bottom and now radiates to the top, denies n/v/d/fevers, denies urinary symptoms, last BM today.

## 2017-10-23 MED ORDER — FAMOTIDINE 20 MG PO TABS: 20.0000 mg | ORAL_TABLET | Freq: Every day | ORAL | 0 refills | Status: DC

## 2017-10-23 NOTE — Discharge Instructions (Signed)
Return to ER if your abdominal pain comes back and is severe, or if you have vomiting, fever, or blood in your stool.

## 2017-10-23 NOTE — ED Provider Notes (Signed)
[facility] EMERGENCY DEPARTMENT Provider Note   CSN: [id number] Arrival date & time: 10/22/17  1904     History   Chief Complaint Chief Complaint  Patient presents with  . Abdominal Pain    HPI Regina [name] is a 32 y.o. female.  32 year old healthy female who presents with abdominal pain.  She had some upper abdominal pain at 5:30pm but it resolved, then came back around 3 am. It just resolved again a few minutes ago. Pain starts in upper abdomen and goes downward into lower abdomen, some radiation to the back. No fevers, urinary symptoms, nausea, vomiting, diarrhea, or vaginal bleeding/discharge. No association with eating. She denies heartburn, NSAID use, or alcohol use. No medications PTA. She has been drinking gatorade and water in the waiting room with no problems. Currently she denies any complaints.  The history is provided by the patient. The history is limited by a language barrier. A language interpreter was used.  Abdominal Pain   Pertinent negatives include fever, diarrhea, nausea, vomiting and dysuria.    Past Medical History:  Diagnosis Date  . Medical history non-contributory     There are no active problems to display for this patient.   Past Surgical History:  Procedure Laterality Date  . CESAREAN SECTION       OB History    Gravida  3   Para  3   Term  3   Preterm  0   AB  0   Living  3     SAB  0   TAB  0   Ectopic  0   Multiple  0   Live Births  1            Home Medications    Prior to Admission medications   Medication Sig Start Date End Date Taking? Authorizing Provider  ibuprofen (ADVIL,MOTRIN) 600 MG tablet Take 1 tablet (600 mg total) by mouth every 6 (six) hours as needed. 03/02/14   Arabella Merles, CNM  Prenatal Vit-Fe Fumarate-FA (PRENATAL VITAMIN PO) Take by mouth.    [provider]    Family History Family History  Problem Relation Age of Onset  . Heart disease  Mother   . Hypertension Mother   . Diabetes Mother   . Hyperlipidemia Mother     Social History Social History   Tobacco Use  . Smoking status: Never Smoker  . Smokeless tobacco: Never Used  Substance Use Topics  . Alcohol use: No  . Drug use: No     Allergies   Patient has no known allergies.   Review of Systems Review of Systems  Constitutional: Negative for fever.  Gastrointestinal: Positive for abdominal pain. Negative for diarrhea, nausea and vomiting.  Genitourinary: Negative for dysuria, vaginal bleeding and vaginal discharge.   All other systems reviewed and are negative except that which was mentioned in HPI   Physical Exam Updated Vital Signs BP 107/74 (BP Location: Right Arm)   Pulse 71   Temp 98.6 F (37 C) (Oral)   Resp 15   SpO2 100%   Physical Exam  Constitutional: She is oriented to person, place, and time. She appears well-developed and well-nourished. No distress.  HENT:  Head: Normocephalic and atraumatic.  Several missing teeth  Eyes: Pupils are equal, round, and reactive to light. Conjunctivae are normal.  Neck: Neck supple.  Cardiovascular: Normal rate, regular rhythm and normal heart sounds.  No murmur heard. Pulmonary/Chest: Effort normal and breath sounds  normal.  Abdominal: Soft. Bowel sounds are normal. She exhibits no distension. There is no tenderness. There is negative Murphy's sign.  Musculoskeletal: She exhibits no edema.  Neurological: She is alert and oriented to person, place, and time.  Fluent speech  Skin: Skin is warm and dry.  Psychiatric: She has a normal mood and affect. Judgment normal.  Nursing note and vitals reviewed.    ED Treatments / Results  Labs (all labs ordered are listed, but only abnormal results are displayed) Labs Reviewed  COMPREHENSIVE METABOLIC PANEL - Abnormal; Notable for the following components:      Result Value   Chloride 100 (*)    Glucose, Bld 126 (*)    All other components within  normal limits  CBC - Abnormal; Notable for the following components:   WBC 12.9 (*)    All other components within normal limits  URINALYSIS, ROUTINE W REFLEX MICROSCOPIC - Abnormal; Notable for the following components:   APPearance HAZY (*)    Hgb urine dipstick SMALL (*)    Leukocytes, UA TRACE (*)    Bacteria, UA RARE (*)    All other components within normal limits  LIPASE, BLOOD  I-STAT BETA HCG BLOOD, ED ([facility], [facility], [facility] ONLY)    EKG None  Radiology No results found.  Procedures Procedures (including critical care time)  Medications Ordered in ED Medications - No data to display   Initial Impression / Assessment and Plan / ED Course  I have reviewed the triage vital signs and the nursing notes.  Pertinent labs  that were available during my care of the patient were reviewed by me and considered in my medical decision making (see chart for details).    PT well appearing on my exam, VS normal. She had no abdominal tenderness and denied any abdominal pain.  She has been tolerating liquids well in the ED.  Labs show normal LFTs and lipase, UA without infection or significant hematuria. Discussed possibility of GERD or potentially gallstones but given no symptoms currently and no tenderness on exam, I feel she is appropriate for outpatient f/u and continued work up if symptoms are recurrent. I doubt appendicitis or GYN pathology given no lower abdominal tenderness or GU symptoms. I recommended low fat diet and avoidance of greasy and spicy foods. Provided w/ pepcid to try as needed if pain returns. Extensively reviewed return precautions including sustained abdominal pain, fever, vomiting, bloody stools.  She voiced understanding and was discharged in satisfactory condition.  Final Clinical Impressions(s) / ED Diagnoses   Final diagnoses:  Intermittent abdominal pain    ED Discharge Orders    None       Little, Ambrose Finland, MD 10/23/17 [phone]

## 2017-10-23 NOTE — ED Notes (Signed)
Pt reports having abd pains across her abd and into her back. Pt states she had this one time before but not this bad. Pt reports she is currently pain free.

## 2019-12-20 ENCOUNTER — Ambulatory Visit: Payer: Self-pay | Attending: Internal Medicine

## 2019-12-20 DIAGNOSIS — Z23 Encounter for immunization: Secondary | ICD-10-CM

## 2019-12-20 NOTE — Progress Notes (Signed)
° °  Covid-19 Vaccination Clinic  Name:  Veleka Djordjevic    MRN: [id number] DOB: 13-Jun-1985  12/20/2019  Ms. Skye Plamondon was observed post Covid-19 immunization for 15 minutes without incident. She was provided with Vaccine Information Sheet and instruction to access the V-Safe system.   Ms. Anasia Agro was instructed to call 911 with any severe reactions post vaccine:  Difficulty breathing   Swelling of face and throat   A fast heartbeat   A bad rash all over body   Dizziness and weakness   Immunizations Administered    Name Date Dose VIS Date Route   Pfizer COVID-19 Vaccine 12/20/2019  1:17 PM 0.3 mL 07/28/2018 Intramuscular   Manufacturer: [employer], [employer]   Lot: [id number]   NDC: [address]-3

## 2020-01-11 ENCOUNTER — Ambulatory Visit: Payer: Self-pay | Attending: Internal Medicine

## 2020-01-11 DIAGNOSIS — Z23 Encounter for immunization: Secondary | ICD-10-CM

## 2020-01-11 NOTE — Progress Notes (Signed)
   Covid-19 Vaccination Clinic  Name:  Regina [name]    MRN: [id number] DOB: 11/25/1985  01/11/2020  Ms. Regina [name] was observed post Covid-19 immunization for 15 minutes without incident. She was provided with Vaccine Information Sheet and instruction to access the V-Safe system.   Ms. Regina [name] was instructed to call 911 with any severe reactions post vaccine: Marland Kitchen Difficulty breathing  . Swelling of face and throat  . A fast heartbeat  . A bad rash all over body  . Dizziness and weakness   Immunizations Administered    Name Date Dose VIS Date Route   Pfizer COVID-19 Vaccine 01/11/2020  1:36 PM 0.3 mL 07/28/2018 Intramuscular   Manufacturer: [employer], [employer]   Lot: [id number]   NDC: [address]-5

## 2020-06-03 NOTE — L&D Delivery Note (Signed)
OB/GYN Faculty Practice Delivery Note  Regina [name] is a 35 y.o. [phone] s/p NSVD at [redacted]w[redacted]d. She was admitted for IOL/TOLAC for GDMA2.   ROM: 0h 74m with clear fluid GBS Status: Negative Maximum Maternal Temperature: 98.1  Labor Progress: Presented for induction, had foley bulb placed and progressed to >4cm, was started on pitocin and progressed to fully  Delivery Date/Time: 02/03/2021 at 0155 Delivery: Called to room and patient was complete and pushing. Head delivered OA. No nuchal cord present. Shoulder and body delivered in usual fashion. Infant with spontaneous cry, placed on mother's abdomen, dried and stimulated. Cord clamped x 2 after 1-minute delay, and cut by RN . Cord blood drawn. Placenta delivered spontaneously with gentle cord traction. Fundus firm with massage and Pitocin. Labia, perineum, vagina, and cervix inspected and found to have no lacerations.   Placenta: intact, 3V cord, L&D Complications: None Lacerations: None EBL: 75cc Analgesia: epidural  Infant: female  APGARs 8,9  weight pending  Warner Mccreedy, MD, MPH OB Fellow, Faculty Practice Center for [facility], [facility] 02/03/2021, 3:52 AM

## 2020-10-12 LAB — OB RESULTS CONSOLE RPR: RPR: NONREACTIVE

## 2020-10-12 LAB — HEPATITIS C ANTIBODY: HCV Ab: NEGATIVE

## 2020-10-12 LAB — CYTOLOGY - PAP: Pap: NEGATIVE

## 2020-10-12 LAB — OB RESULTS CONSOLE RUBELLA ANTIBODY, IGM: Rubella: IMMUNE

## 2020-10-12 LAB — OB RESULTS CONSOLE ABO/RH: RH Type: POSITIVE

## 2020-10-12 LAB — GLUCOSE TOLERANCE, 1 HOUR: Glucose, 1 Hour GTT: 153

## 2020-10-12 LAB — OB RESULTS CONSOLE HGB/HCT, BLOOD
HCT: 35 (ref 29–41)
Hemoglobin: 11.9

## 2020-10-12 LAB — OB RESULTS CONSOLE HIV ANTIBODY (ROUTINE TESTING): HIV: NONREACTIVE

## 2020-10-12 LAB — OB RESULTS CONSOLE ANTIBODY SCREEN: Antibody Screen: NEGATIVE

## 2020-10-12 LAB — OB RESULTS CONSOLE GC/CHLAMYDIA
Chlamydia: NEGATIVE
Gonorrhea: NEGATIVE

## 2020-10-12 LAB — SICKLE CELL SCREEN: Sickle Cell Screen: NORMAL

## 2020-10-12 LAB — OB RESULTS CONSOLE PLATELET COUNT: Platelets: 230

## 2020-10-12 LAB — OB RESULTS CONSOLE VARICELLA ZOSTER ANTIBODY, IGG: Varicella: IMMUNE

## 2020-10-12 LAB — OB RESULTS CONSOLE HEPATITIS B SURFACE ANTIGEN: Hepatitis B Surface Ag: NEGATIVE

## 2020-10-13 ENCOUNTER — Other Ambulatory Visit: Payer: Self-pay | Admitting: Family Medicine

## 2020-10-13 DIAGNOSIS — Z363 Encounter for antenatal screening for malformations: Secondary | ICD-10-CM

## 2020-10-13 DIAGNOSIS — O24419 Gestational diabetes mellitus in pregnancy, unspecified control: Secondary | ICD-10-CM

## 2020-10-13 DIAGNOSIS — Z3A28 28 weeks gestation of pregnancy: Secondary | ICD-10-CM

## 2020-10-13 DIAGNOSIS — O09523 Supervision of elderly multigravida, third trimester: Secondary | ICD-10-CM

## 2020-10-13 LAB — GLUCOSE TOLERANCE, 3 HOURS
Glucose, GTT - 1 Hour: 202 — AB (ref ?–200)
Glucose, GTT - 2 Hour: 195 — AB (ref ?–140)
Glucose, GTT - 3 Hour: 157 mg/dL — AB (ref ?–140)
Glucose, GTT - Fasting: 108 mg/dL (ref 80–110)

## 2020-10-17 ENCOUNTER — Other Ambulatory Visit: Payer: Self-pay | Admitting: *Deleted

## 2020-10-17 ENCOUNTER — Encounter: Payer: Self-pay | Attending: Obstetrics & Gynecology | Admitting: Dietician

## 2020-10-17 DIAGNOSIS — O24419 Gestational diabetes mellitus in pregnancy, unspecified control: Secondary | ICD-10-CM

## 2020-10-24 ENCOUNTER — Other Ambulatory Visit: Payer: Self-pay

## 2020-10-25 ENCOUNTER — Encounter: Payer: Self-pay | Admitting: *Deleted

## 2020-10-25 ENCOUNTER — Encounter: Payer: Self-pay | Admitting: General Practice

## 2020-10-31 ENCOUNTER — Encounter: Payer: Self-pay | Admitting: Family Medicine

## 2020-10-31 ENCOUNTER — Other Ambulatory Visit: Payer: Self-pay

## 2020-10-31 ENCOUNTER — Ambulatory Visit (INDEPENDENT_AMBULATORY_CARE_PROVIDER_SITE_OTHER): Payer: Self-pay | Admitting: Family Medicine

## 2020-10-31 ENCOUNTER — Encounter: Payer: Self-pay | Attending: Obstetrics & Gynecology | Admitting: Registered"

## 2020-10-31 ENCOUNTER — Ambulatory Visit: Payer: Self-pay | Admitting: Registered"

## 2020-10-31 VITALS — BP 99/52 | HR 81 | Wt 235.7 lb

## 2020-10-31 DIAGNOSIS — Z3A00 Weeks of gestation of pregnancy not specified: Secondary | ICD-10-CM | POA: Insufficient documentation

## 2020-10-31 DIAGNOSIS — O24419 Gestational diabetes mellitus in pregnancy, unspecified control: Secondary | ICD-10-CM

## 2020-10-31 DIAGNOSIS — O0990 Supervision of high risk pregnancy, unspecified, unspecified trimester: Secondary | ICD-10-CM

## 2020-10-31 DIAGNOSIS — Z98891 History of uterine scar from previous surgery: Secondary | ICD-10-CM

## 2020-10-31 NOTE — Progress Notes (Signed)
Interpreter services provided by Raquel from [facility]  Patient was seen on 10/31/20 for Gestational Diabetes self-management. EDD 03/06/21; [redacted]w[redacted]d Patient states no history of GDM. Diet history obtained. Patient eats variety of all food groups. Pt states she does not eat much rice, due to food preference. Beverages include water, fruit water such as blended watermelon.    The following learning objectives were met by the patient :   States the definition of Gestational Diabetes  States why dietary management is important in controlling blood glucose  Describes the effects of carbohydrates on blood glucose levels  Demonstrates ability to create a balanced meal plan  Demonstrates carbohydrate counting   States when to check blood glucose levels  Demonstrates proper blood glucose monitoring techniques  States the effect of stress and exercise on blood glucose levels  States the importance of limiting caffeine and abstaining from alcohol and smoking  Plan:  Aim for 3 Carbohydrate Choices per meal (45 grams) +/- 1 either way  Aim for 1-2 Carbohydrate Choices per snack Begin reading food labels for Total Carbohydrate of foods If OK with your MD, consider  increasing your activity level by walking, Arm Chair Exercises or other activity daily as tolerated Begin checking Blood Glucose before breakfast and 2 hours after first bite of breakfast, lunch and dinner as directed by MD  Bring Log Book/Sheet and meter to every medical appointment  Take medication if directed by MD  Blood glucose monitor given: [employer] Lot # [id number]: 98 mg/dL  After patient has Medicaid in place an Rx order will be placed for  Accu-chek Guide strips and Softclix lancets  Patient instructed to monitor glucose levels: FBS: 60 - 95 mg/dl 2 hour: <120 mg/dl  Patient received the following handouts:  Nutrition Diabetes and Pregnancy  Carbohydrate Counting List  Blood glucose Log Sheet  Patient will  be seen for follow-up as needed.

## 2020-10-31 NOTE — Progress Notes (Signed)
   Subjective:  Regina [name] is a 35 y.o. [phone] at [redacted]w[redacted]d being seen today for ongoing prenatal care.  She is currently monitored for the following issues for this high-risk pregnancy and has Supervision of high risk pregnancy, antepartum and Gestational diabetes mellitus (GDM) affecting fourth pregnancy on their problem list.  Patient reports no complaints.  Contractions: Not present. Vag. Bleeding: None.  Movement: Present. Denies leaking of fluid.   The following portions of the patient's history were reviewed and updated as appropriate: allergies, current medications, past family history, past medical history, past social history, past surgical history and problem list. Problem list updated.  Objective:   Vitals:   10/31/20 1352  BP: (!) 99/52  Pulse: 81  Weight: 235 lb 11.2 oz (106.9 kg)    Fetal Status: Fetal Heart Rate (bpm): 154   Movement: Present     General:  Alert, oriented and cooperative. Patient is in no acute distress.  Skin: Skin is warm and dry. No rash noted.   Cardiovascular: Normal heart rate noted  Respiratory: Normal respiratory effort, no problems with respiration noted  Abdomen: Soft, gravid, appropriate for gestational age. Pain/Pressure: Absent     Pelvic: Vag. Bleeding: None     Cervical exam deferred        Extremities: Normal range of motion.  Edema: Trace  Mental Status: Normal mood and affect. Normal behavior. Normal judgment and thought content.   Urinalysis:      Assessment and Plan:  Pregnancy: [id number] at [redacted]w[redacted]d  1. Supervision of high risk pregnancy, antepartum BP and FHR normal Dating reviewed, has limited anatomy scan from 10/19/2020 with 5wk difference from LMP, will use this scan as dating with [facility] 03/06/2021 Prenatal labs reviewed, unremarkable with exception of GTT Reviewed nature of [facility] practices with multiple providers and learners  2. Gestational diabetes mellitus (GDM) affecting fourth pregnancy Failed 1 and 3 hr  GTT's spectacularly prior to [redacted] weeks GA, suspect pre-existing DM and will treat as such Meeting with DM educator later today, return in 2 weeks to review sugars Reviewed complications of uncontrolled GDM (stillbirth, macrosomia, labor dystocias, postpartum glycemic control problems) and emphasized importance of checking sugars regularly to help prevent these Check A1c today Fetal echo ordered today Has growth scheduled for early next month - HgB A1c - US Fetal Echocardiography; Future  3. History of VBAC CS>VBAC x2, desires TOLAC  Preterm labor symptoms and general obstetric precautions including but not limited to vaginal bleeding, contractions, leaking of fluid and fetal movement were reviewed in detail with the patient. Please refer to After Visit Summary for other counseling recommendations.  Return in 2 weeks (on 11/14/2020) for [facility], ob visit, review glucose log.   Venora Maples, MD

## 2020-10-31 NOTE — Patient Instructions (Signed)
[email or web address].com">  Diabetes mellitus gestacional, diagnstico Gestational Diabetes Mellitus, Diagnosis  La diabetes mellitus gestacional, o diabetes gestacional, es una forma de diabetes que algunas mujeres desarrollan durante el [place]. Para controlar el nivel de azcar en la sangre (glucosa) en el cuerpo, el pncreas produce una hormona llamada insulina. Esta hormona permite el ingreso de la glucosa en las clulas del cuerpo. Las clulas usan la glucosa para Psychiatrist. Sin embargo, en algunas mujeres embarazadas, es posible que el pncreas no produzca la cantidad suficiente de insulina o que cuerpo no utilice adecuadamente la insulina disponible. Esto causa diabetes gestacional. La diabetes gestacional solo dura un corto tiempo. Generalmente, sucede alrededor de la semana 24a la 28de gestacin y desaparece despus del parto. Sin embargo, las mujeres que sufren diabetes gestacional son ms propensas a lo siguiente:  Programme researcher, broadcasting/film/video a desarrollarla nuevamente si quedan embarazadas.  Desarrollar diabetes tipo2 en el futuro. Si la diabetes gestacional se trata, no es probable que [employer]. Si esta afeccin no se controla con tratamiento, puede ocasionar problemas durante el [place] de parto y [place]. Algunos problemas pueden daar al beb y a la [place]. Cules son las causas? Esta afeccin se manifiesta durante el embarazo, cuando el cuerpo de la [place] produce hormonas de crecimiento que ayudan al beb a desarrollarse. A veces, estas hormonas:  Hacen que el pncreas trabaje ms de lo normal para producir ms insulina. A veces, el pncreas igualmente no puede producir suficiente insulina.  Causan resistencia a la insulina. Esto hace que sea difcil para las clulas Product manager. La resistencia a la insulina o la falta de insulina hacen que el exceso de glucosa se acumule en la sangre, en lugar de ir a las clulas. Esto deriva en glucemia alta  (hiperglucemia). Qu incrementa el riesgo? Es ms probable que Dietitian en las mujeres embarazadas que:  Son [place] de 23aos durante el [place].  Tienen antecedentes familiares de diabetes.  Tienen sobrepeso.  Han tenido diabetes gestacional en el pasado.  Tienen el sndrome del ovario poliqustico (SOP).  Estn embarazadas de ms de un beb. Cules son los signos o sntomas? Los sntomas frecuentes de esta afeccin incluyen los siguientes:  Aumento de la sed.  Aumento del apetito.  Necesidad de orinar con ms frecuencia. La mayora de las mujeres no perciben estos sntomas porque son similares a otros sntomas del Psychiatrist. Cmo se diagnostica? Esta afeccin puede diagnosticarse en funcin del nivel de glucemia. Este puede revisarse:  Despus de ayunar durante 8 horas o ms tiempo (prueba de glucemia en ayunas).  En cualquier momento del da, sin importar cundo comi (prueba de glucosa al azar).  En las semanas 24 a 28 del embarazo, para Financial risk analyst cmo responde el cuerpo a la glucosa (prueba oral de tolerancia a la glucosa). Si tiene factores de [place], pueden hacerle pruebas de Airline pilot de la diabetes tipo2 en la primera visita de atencin Training and development officer. Cmo se trata? El tratamiento de esta afeccin depende de la etapa del embarazo y de cualquier otra afeccin que pudiera Warehouse manager. El tratamiento puede incluir:  Consumir una dieta saludable y aumentar la actividad fsica. Estas son las formas ms importantes de Pharmacologist la diabetes gestacional bajo control.  Controlarse la glucemia con la frecuencia que le hayan indicado.  Aplicarse insulina o tomar otros medicamentos para la diabetes todos los [place]. Estos medicamentos se recetarn solamente si son necesarios. Es posible que deba trabajar con un especialista en diabetes, o endocrinlogo, en un  plan de tratamiento. El objetivo del tratamiento es que tenga los niveles adecuados de  glucemia durante el [place]. Dichos niveles se controlan en ayunas y despus de comer. El mdico le indicar cmo deben ser esos niveles. Siga estas instrucciones en su casa: Infrmese sobre su enfermedad Infrmese todo lo que pueda sobre su enfermedad. Pregntele al mdico:  Con qu frecuencia debo controlar mi glucemia y dnde obtengo el equipo?  Qu medicamentos para la diabetes necesito y cundo debo tomarlos?  Debo consultar con un especialista certificado en atencin y educacin sobre la diabetes?  A qu nmero puedo llamar si tengo preguntas?  Dnde puedo encontrar un grupo de apoyo para mujeres con diabetes gestacional? Indicaciones generales  Use los medicamentos de venta libre y los recetados solamente como se lo haya indicado el mdico.  Controle el aumento de peso durante el [place]. El aumento de peso esperado depende de su IMC (ndice de masa corporal) antes del embarazo.  Beba suficiente lquido como para Pharmacologist la orina de color amarillo plido.  Lleve con usted una tarjeta o use un brazalete o una medalla que indiquen que tiene diabetes gestacional.  Cumpla con todas las visitas de seguimiento. Esto es importante. Dnde buscar ms informacin  American Diabetes Association (ADA) (Asociacin Estadounidense de la Diabetes): diabetes.org  Association of Diabetes Care & Education Specialists (ADCES) (Asociacin de Especialistas en Atencin y Educacin sobre la Diabetes): diabeteseducator.org  Centers for Disease Control and Prevention (Centros para el Control y la Prevencin de [place], CDC): TonerPromos.no  American Pregnancy Association (Asociacin Americana del [place]): americanpregnancy.org  U.S. Department of Therapist, sports (MyPlate del Departamento de Agricultura de los EE.UU.): WrestlingReporter.dk Comunquese con un mdico si:  Tiene un nivel de glucemia de o por encima de: ? 240mg /dl ( ). ? 200 mg/dl (09.4BSJG/G mmol/l) y tiene 83.6. Las cetonas son producidas por el hgado cuando la falta de glucosa obliga al cuerpo a usar la grasa para [employer].  Tiene fiebre.  Se ha sentido enferma durante 2 o ms das y no mejora.  Tiene alguno de estos problemas durante ms de 6horas: ? Vmitos cada vez que come o bebe. ? Diarrea. Solicite ayuda de inmediato si:  Se siente confundida o no puede pensar con claridad.  Tiene dificultad para respirar.  Tiene un nivel moderado o alto de cetonas en la [place].  Siente que el beb se mueve menos de lo habitual.  Tiene secreciones inusuales o sangrado de la vagina.  Comienza a tener contracciones antes de tiempo (prematuras). Las contracciones se pueden sentir como un endurecimiento de la parte inferior del abdomen.  Tiene un dolor de cabeza intenso. Estos sntomas pueden representar un problema grave que constituye [place]. No espere a ver si los sntomas desaparecen. Solicite atencin mdica de inmediato. Comunquese con el servicio de emergencias de su localidad (911 en los Estados Unidos). No conduzca por sus propios medios Radio broadcast assistant hospital. Resumen  La diabetes mellitus gestacional es una forma de diabetes que algunas mujeres desarrollan durante el [place]. Generalmente, sucede alrededor de la semana 24a la 28de gestacin y desaparece despus del parto.  El tratamiento puede incluir seguir una dieta saludable y aumentar la actividad fsica. Tambin pueden darle insulina u otros medicamentos para la diabetes.  Comunquese con un mdico si sus niveles de glucosa alcanzan los 240mg /dl ([place]) o ms.  Obtenga ayuda de inmediato si se siente confundida o no puede pensar con claridad, o si siente que el beb se mueve menos que  lo habitual. Esta informacin no tiene como fin reemplazar el consejo del mdico. Asegrese de hacerle al mdico cualquier pregunta que tenga. Document Revised: 12/21/2019 Document Reviewed: 12/21/2019 Elsevier Patient  Education  2021 Elsevier Inc.   [employer] del mtodo anticonceptivo Contraception Choices La anticoncepcin, o los mtodos anticonceptivos, hace referencia a los mtodos o dispositivos que evitan el [place]. Mtodos hormonales Implante anticonceptivo Un implante anticonceptivo consiste en un tubo delgado de plstico que contiene una hormona que evita el [place]. Es diferente de un dispositivo intrauterino (DIU). Un mdico lo inserta en la parte superior del brazo. Los implantes pueden ser eficaces durante un mximo de 3 aos. Inyecciones de progestina sola Las inyecciones de progestina sola contienen progestina, una forma sinttica de la hormona progesterona. Un mdico las administra cada 3 meses. Pldoras anticonceptivas Las pldoras anticonceptivas son pastillas que contienen hormonas que evitan el [place]. Deben tomarse una vez al da, preferentemente a la misma Economist. Se necesita una receta para utilizar este mtodo anticonceptivo. Parche anticonceptivo El parche anticonceptivo contiene hormonas que evitan el [place]. Se coloca en la piel, debe cambiarse una vez a la semana durante tres semanas y debe retirarse en la cuarta semana. Se necesita una receta para utilizar este mtodo anticonceptivo. Anillo vaginal Un anillo vaginal contiene hormonas que evitan el embarazo. Se coloca en la vagina durante tres semanas y se retira en la cuarta semana. Luego se repite el proceso con un anillo nuevo. Se necesita una receta para utilizar este mtodo anticonceptivo. Anticonceptivo de emergencia Los anticonceptivos de emergencia son mtodos para evitar un embarazo despus de Warehouse manager sexo sin proteccin. Vienen en forma de pldora y pueden tomarse hasta 5 das despus de [place]. Funcionan mejor cuando se toman lo ms pronto posible luego de eBay. La mayora de los anticonceptivos de emergencia estn disponibles sin receta mdica. Este mtodo no debe utilizarse como el nico mtodo  anticonceptivo.   Mtodos de barrera Condn masculino Un condn masculino es una vaina delgada que se coloca sobre el pene durante el sexo. Los condones evitan que el esperma ingrese en el cuerpo de la [place]. Pueden utilizarse con un una sustancia que mata a los espermatozoides (espermicida) para aumentar la efectividad. Deben desecharse despus de un uso. Condn femenino Un condn femenino es una vaina blanda y holgada que se coloca en la vagina antes de [place]. El condn evita que el esperma ingrese en el cuerpo de la [place]. Deben desecharse despus de un uso. Diafragma Un diafragma es una barrera blanda con forma de cpula. Se inserta en la vagina antes del sexo, junto con un espermicida. El diafragma bloquea el ingreso de esperma en el tero, y el espermicida mata a los espermatozoides. El Designer, fashion/clothing en la vagina durante 6 a 8 horas despus de Warehouse manager sexo y debe retirarse en el plazo de las 24 horas. Un diafragma es recetado y colocado por un mdico. Debe reemplazarse cada 1 a 2 aos, despus de dar a luz, de aumentar ms de 15lb (6.8kg) y de [place] plvica. Capuchn cervical Un capuchn cervical es una copa redonda y blanda de ltex o plstico que se coloca en el cuello uterino. Se inserta en la vagina antes del sexo, junto con un espermicida. Bloquea el ingreso del esperma en el tero. El capuchn Radio producer durante 6 a 8 horas despus de Warehouse manager sexo y debe retirarse en el plazo de las 48 horas. Un capuchn cervical debe ser recetado y colocado  por un mdico. Debe reemplazarse cada 2aos. Esponja Una esponja es una pieza blanda y circular de espuma de poliuretano que contiene espermicida. La esponja ayuda a bloquear el ingreso de esperma en el tero, y el espermicida mata a los espermatozoides. Belva Bertin, debe humedecerla e insertarla en la vagina. Debe insertarse antes de eBay, debe permanecer dentro al menos durante 6 horas despus de tener  sexo y debe retirarse y Nurse, adult en el plazo de las 30 horas. Espermicidas Los espermicidas son sustancias qumicas que matan o bloquean al esperma y no lo dejan ingresar al cuello uterino y al tero. Vienen en forma de crema, gel, supositorio, espuma o comprimido. Un espermicida debe insertarse en la vagina con un aplicador al menos 10 o 15 minutos antes de tener sexo para dar tiempo a que surta [place]. El proceso debe repetirse cada vez que tenga sexo. Los espermicidas no requieren Emergency planning/management officer.   Anticonceptivos intrauterinos Dispositivo intrauterino (DIU) Un DIU es un dispositivo en forma de T que se coloca en el tero. Existen dos tipos:  DIU hormonal.Este tipo contiene progestina, una forma sinttica de la hormona progesterona. Este tipo puede permanecer colocado durante 3 a 5 aos.  DIU de cobre.Este tipo est recubierto con un alambre de cobre. Puede permanecer colocado durante 10 aos. Mtodos anticonceptivos permanentes Ligadura de trompas en la mujer En este mtodo, se sellan, atan u obstruyen las trompas de [place]op[place] durante una ciruga para Automotive engineer que el vulo descienda [place]. Esterilizacin histeroscpica En este mtodo, se coloca un implante pequeo y flexible dentro de cada trompa de Falopio. Los implantes hacen que se forme un tejido cicatricial en las trompas de Falopio y que las obstruya para que el espermatozoide no pueda llegar al vulo. El procedimiento demora alrededor de 3 meses para que sea [place]. Debe utilizarse otro mtodo anticonceptivo durante esos 3 meses. Esterilizacin masculina Este es un procedimiento que consiste en atar los conductos que transportan el esperma (vasectoma). Luego del procedimiento, el hombre Manufacturing engineer lquido (semen). Debe utilizarse otro mtodo anticonceptivo durante 3 meses despus del procedimiento. Mtodos de planificacin natural Planificacin familiar natural En este mtodo, la pareja no tiene [employer]  la mujer podra quedar [place]. Mtodo calendario En este mtodo, la mujer realiza un seguimiento de la duracin de cada ciclo menstrual, identifica los [employer] que se puede producir un Psychiatrist y no tiene sexo durante esos [address]  [address]. Mtodo de la ovulacin En este mtodo, la pareja evita tener sexo durante la ovulacin. Mtodo sintotrmico Este mtodo implica no tener sexo durante la ovulacin. Normalmente, la mujer comprueba la ovulacin al observar cambios en su temperatura y en la consistencia del moco cervical. Mtodo posovulacin En este mtodo, la pareja espera a que finalice la ovulacin para Doctor, hospital. Dnde buscar ms informacin  Centers for Disease Control and Prevention (Centros para el Control y Psychiatrist de Event organiser): FootballExhibition.com.br Resumen  La anticoncepcin, o los mtodos anticonceptivos, hace referencia a los mtodos o dispositivos que evitan el [place].  Los mtodos anticonceptivos hormonales incluyen implantes, inyecciones, pastillas, parches, anillos vaginales y anticonceptivos de Associate Professor.  Los mtodos anticonceptivos de barrera pueden incluir condones masculinos, condones femeninos, diafragmas, capuchones cervicales, esponjas y espermicidas.  [employer] tipos de DIU (dispositivo intrauterino). Un DIU puede colocarse en el tero de una mujer para evitar el embarazo durante 3 a 5 aos.  La esterilizacin permanente puede realizarse mediante un procedimiento tanto en los hombres como en las mujeres. Los [employer]  de planificacin familiar natural implican no tener [employer] la mujer podra quedar [place]. Esta informacin no tiene Theme park manager el consejo del mdico. Asegrese de hacerle al mdico cualquier pregunta que tenga. Document Revised: 12/21/2019 Document Reviewed: 12/21/2019 Elsevier Patient Education  2021 Elsevier Inc.   [place] materna Breastfeeding  Decidir amamantar es una de las mejores elecciones que puede  hacer por usted y su beb. Un cambio en las hormonas durante el embarazo hace que las mamas produzcan leche materna en las glndulas productoras de [place]. Las hormonas impiden que la leche materna sea liberada antes del nacimiento del beb. Adems, impulsan el flujo de leche luego del nacimiento. Una vez que ha comenzado a Museum/gallery exhibitions officer, Conservation officer, nature beb, as Immunologist succin o Theatre manager, pueden estimular la liberacin de [place] de las glndulas productoras de [place]. Los beneficios de [employer] investigaciones demuestran que la lactancia materna ofrece muchos beneficios de salud para bebs y [place]. Adems, ofrece una forma gratuita y conveniente de Corporate treasurer al beb. Para el beb  La primera leche (calostro) ayuda a Careers information officer funcionamiento del aparato digestivo del beb.  Las clulas especiales de la leche (anticuerpos) ayudan a Artist las infecciones en el beb.  Los bebs que se alimentan con leche materna tambin tienen menos probabilidades de tener asma, alergias, obesidad o diabetes de tipo 2. Adems, tienen menor riesgo de sufrir el sndrome de muerte sbita del lactante (SMSL).  Los nutrientes de la [place] materna son mejores para Patent examiner las necesidades del beb en comparacin con la [employer].  La leche materna mejora el desarrollo cerebral del beb. Para usted  La lactancia materna favorece el desarrollo de un vnculo muy especial entre la madre y el beb.  Es conveniente. La leche materna es econmica y siempre est disponible a la Human resources officer.  La lactancia materna ayuda a quemar caloras. [place] a perder el peso ganado durante el [place].  Hace que el tero vuelva al tamao que tena antes del embarazo ms rpido. Adems, disminuye el sangrado (loquios) despus del parto.  La lactancia materna contribuye a reducir Nurse, adult de tener diabetes de tipo 2, osteoporosis, artritis reumatoide, enfermedades cardiovasculares y cncer de mama, ovario, tero y  endometrio en el futuro. Informacin bsica sobre la lactancia Comienzo de la lactancia  Encuentre un lugar cmodo para sentarse o Teacher, music, con un buen respaldo para el cuello y la espalda.  Coloque una almohada o una manta enrollada debajo del beb para acomodarlo a la altura de la mama (si est sentada). Las almohadas para Museum/gallery exhibitions officer se han diseado especialmente a fin de servir de apoyo para los brazos y el beb [employer].  Asegrese de que la barriga del beb (abdomen) est frente a la suya.  [place] suavemente la mama. Con las yemas de los dedos, [employer] bordes exteriores de la mama hacia adentro, en direccin al pezn. Esto estimula el flujo de [place]. Si la [employer], es posible que deba Educational psychologist con este movimiento durante la Market researcher.  Sostenga la mama con 4 dedos por debajo y Multimedia programmer por arriba del pezn (forme la letra "C" con la mano). Asegrese de que los dedos se encuentren lejos del pezn y de la boca del beb.  Empuje suavemente los labios del beb con el pezn o con el dedo.  Cuando la boca del beb se abra lo suficiente, acrquelo rpidamente a la mama e introduzca todo el pezn y la arola, [place]  sea posible, dentro de la boca del beb. La arola es la zona de color que rodea al pezn. ? Debe haber ms arola visible por arriba del labio superior del beb que por debajo del labio inferior. ? Los labios del beb deben estar abiertos y extendidos hacia afuera (evertidos) para asegurar que el beb se prenda de forma adecuada y cmoda. ? La lengua del beb debe estar entre la enca inferior y Educational psychologist.  Asegrese de que la boca del beb est en la posicin correcta alrededor del pezn (prendido). Los labios del beb deben crear un sello sobre la mama y estar doblados hacia afuera (invertidos).  Es comn que el beb succione durante 2 a 3 minutos para que comience el flujo de [place]. Cmo debe prenderse Es muy importante que le ensee al  beb cmo prenderse adecuadamente a la mama. Si el beb no se prende adecuadamente, puede causar [employer], reducir la produccin de [place] materna y Radio producer que el beb tenga un escaso aumento de [place]. Adems, si el beb no se prende adecuadamente al pezn, puede tragar aire durante la alimentacin. Esto puede causarle molestias al beb. Hacer eructar al beb al Pilar Plate de mama puede ayudarlo a liberar el aire. Sin embargo, ensearle al beb cmo prenderse a la mama adecuadamente es la mejor manera de evitar que se sienta molesto por tragar Oceanographer se alimenta. Signos de que el beb se ha prendido adecuadamente al pezn  Tironea o succiona de modo silencioso, sin Publishing rights manager. Los labios del beb deben estar extendidos hacia afuera (evertidos).  Se escucha que traga cada 3 o 4 succiones una vez que la [employer] ha comenzado a Radiographer, therapeutic (despus de que se produzca el reflejo de eyeccin de la [place]).  Hay movimientos musculares por arriba y por delante de sus odos al Printmaker. Signos de que el beb no se ha prendido Audiological scientist al pezn  Hace ruidos de succin o de chasquido mientras se Tree surgeon.  Siente dolor en los pezones. Si cree que el beb no se prendi correctamente, deslice el dedo en la comisura de la boca y [employer] las encas del beb para interrumpir la succin. Intente volver a comenzar a Museum/gallery exhibitions officer. Signos de Market researcher materna exitosa Signos del beb  El beb disminuir gradualmente el nmero de succiones o dejar de succionar por completo.  El beb se quedar dormido.  El cuerpo del beb se relajar.  El beb retendr Neomia Dear pequea cantidad de [facility] boca.  El beb se desprender solo del [place]. Signos que presenta usted  Las mamas han aumentado la firmeza, el peso y el tamao 1 a 3 horas despus de Museum/gallery exhibitions officer.  Estn ms blandas inmediatamente despus de amamantar.  Se producen un aumento del volumen de [place] y un cambio en su consistencia y color  hacia el quinto da de Market researcher.  Los pezones no duelen, no estn agrietados ni sangran. Signos de que su beb recibe la cantidad de leche suficiente  Mojar por lo menos 1 o 2paales durante las primeras 24horas despus del nacimiento.  Mojar por lo menos 5 o 6paales cada 24horas durante la primera semana despus del nacimiento. La orina debe ser clara o de color amarillo plido a los 5das de vida.  Mojar entre 6 y 8paales cada 24horas a medida que el beb sigue creciendo y desarrollndose.  Defeca por lo menos 3 veces en 24 horas a los 5 [address] de [address] Las heces deben ser blandas y  amarillentas.  Defeca por lo menos 3 veces en 24 horas a los [address] de [address] Las heces deben ser grumosas y Armed forces operational officer.  No registra una prdida de peso mayor al 10% del peso al nacer durante los primeros 3 [address] de [place].  Aumenta de peso un promedio de 4 a 7onzas (113 a 198g) por semana despus de los 4 [address] de vida.  Aumenta de [place], [place], de [place] uniforme a Glass blower/designer de los 5 [address] de vida, sin Passenger transport manager prdida de peso despus de las 2semanas de vida. Despus de alimentarse, es posible que el beb regurgite una pequea cantidad de [place]. Esto es normal. Frecuencia y duracin de la lactancia El amamantamiento frecuente la ayudar a producir ms [place] y puede prevenir dolores en los pezones y las mamas extremadamente llenas (congestin [place]). Alimente al beb cuando muestre signos de hambre o si siente la necesidad de reducir la congestin de las [place]. Esto se denomina "lactancia a demanda". Las seales de que el beb tiene hambre incluyen las siguientes:  Aumento del [place] de [place], [place] o inquietud.  Mueve la cabeza de un lado a otro.  Abre la boca cuando se le toca la mejilla o la comisura de la boca (reflejo de bsqueda).  Aumenta las vocalizaciones, tales como sonidos de succin, se relame los labios, emite arrullos, suspiros o chirridos.  Mueve la [employer] boca y  se chupa los dedos o las manos.  Est molesto o llora. Evite el uso del chupete en las primeras 4 a 6 semanas despus del nacimiento del beb. Despus de este perodo, podr usar un chupete. Las investigaciones demostraron que el uso del chupete durante Financial risk analyst ao de vida del beb disminuye el riesgo de tener el sndrome de muerte sbita del lactante (SMSL). Permita que el nio se alimente en cada mama todo lo que desee. Cuando el beb se desprende o se queda dormido mientras se est alimentando de la primera mama, ofrzcale la segunda. Debido a que, con frecuencia, los recin nacidos estn somnolientos las primeras semanas de vida, es posible que deba despertar al beb para alimentarlo. Los horarios de Acupuncturist de un beb a otro. Sin embargo, las siguientes reglas pueden servir como gua para ayudarla a Lawyer que el beb se alimenta adecuadamente:  Se puede amamantar a los recin nacidos (bebs de 4 semanas o menos de vida) cada 1 a 3 horas.  No deben transcurrir ms de 3 horas durante el da o 5 horas durante la noche sin que se amamante a los recin nacidos.  Debe amamantar al beb un mnimo de 8 veces en un perodo de 24 horas. Extraccin de [employer] America extraccin y Contractor de la leche materna le permiten asegurarse de que el beb se alimente exclusivamente de su leche materna, aun en momentos en los que no puede Museum/gallery exhibitions officer. Esto tiene especial importancia si debe regresar al Aleen Campi en el perodo en que an est amamantando o si no puede estar presente en los momentos en que el beb debe alimentarse. Su asesor en lactancia puede ayudarla a Clinical research associate un mtodo de extraccin que funcione mejor para usted y Programmer, systems cunto tiempo es seguro almacenar [place].      Cmo cuidar las mamas durante la lactancia Los pezones pueden secarse, Lobbyist y doler durante la Market researcher. Las siguientes recomendaciones pueden ayudarla a Pharmacologist las [employer] y  sanas:  Careers information officer usar jabn en los pezones.  Use un sostn de soporte diseado especialmente  para la Tour manager. Evite usar sostenes con aro o sostenes muy ajustados (sostenes deportivos).  Seque al aire sus pezones durante 3 a despus de amamantar al beb.  Utilice solo apsitos de Haematologist sostn para Environmental health practitioner las prdidas de [place]. La prdida de un poco de [employer] tomas es normal.  Utilice lanolina sobre los pezones luego de Museum/gallery exhibitions officer. La lanolina ayuda a mantener la humedad normal de la piel. La lanolina pura no es perjudicial (no es txica) para el beb. Adems, puede extraer [employer] algunas gotas de [place] materna y Engineer, maintenance (IT) suavemente esa [employer] pezones para que la [place] se seque al aire. Durante las primeras semanas despus del nacimiento, algunas mujeres experimentan [place]. La congestin [employer] [place] [employer] puede hacer que sienta las mamas pesadas, calientes y sensibles al tacto. El pico de la congestin mamaria ocurre en el plazo de los 3 a 5 das despus del [place]. Las siguientes recomendaciones pueden ayudarla a Paramedic la congestin mamaria:  Vace por completo las mamas al [employer] o Environmental health practitioner. Puede aplicar calor hmedo en las mamas (en la ducha o con toallas hmedas para manos) antes de Museum/gallery exhibitions officer o extraer [employer]. Esto aumenta la circulacin y [place] a que la [place]. Si el beb no vaca por completo las [address] cuando lo [address], extraiga la [place] restante despus de que haya finalizado.  Aplique compresas de hielo [employer] inmediatamente despus de Museum/gallery exhibitions officer o extraer [place], a menos que le resulte demasiado incmodo. Haga lo siguiente: ? Ponga el hielo en una bolsa plstica. ? Coloque una First[employer] piel y la bolsa de hielo. ? Coloque el hielo durante , 2 o 3veces por da.  Asegrese de que el beb est prendido y se encuentre en la posicin correcta mientras lo alimenta. Si la congestin mamaria  persiste luego de 48 horas o despus de seguir estas recomendaciones, comunquese con su mdico o un Holiday representative. Recomendaciones de salud general durante la lactancia  Consuma 3 comidas y 3 colaciones saludables todos los [place]. Las [employer] Holdings bien alimentadas que amamantan necesitan entre 450 y 500 caloras adicionales por Futures trader. Puede cumplir con este requisito al aumentar la cantidad de una dieta equilibrada que realice.  Beba suficiente agua para mantener la orina clara o de color amarillo plido.  Descanse con frecuencia, reljese y siga tomando sus vitaminas prenatales para prevenir la fatiga, el estrs y los niveles bajos de vitaminas y [employer] en el cuerpo (deficiencias de nutrientes).  No consuma ningn producto que contenga nicotina o tabaco, como cigarrillos y Administrator, Civil Service. El beb puede verse afectado por las sustancias qumicas de los cigarrillos que pasan a la [place] materna y por la exposicin al humo ambiental del tabaco. Si necesita ayuda para dejar de fumar, consulte al mdico.  Evite el consumo de alcohol.  No consuma drogas ilegales o marihuana.  Antes de Dietitian, hable con el mdico. Estos incluyen medicamentos recetados y de [place], como tambin vitaminas y suplementos a base de hierbas. Algunos medicamentos, que pueden ser perjudiciales para el beb, pueden pasar a travs de la [employer].  Puede quedar embarazada durante la lactancia. Si se desea un mtodo anticonceptivo, consulte al mdico sobre cules son las opciones seguras durante la Market researcher. Dnde encontrar ms informacin: Liga internacional [place]: [email or web address] Comunquese con un mdico si:  Siente que quiere dejar de Museum/gallery exhibitions officer o se siente frustrada con la lactancia.  Sus pezones estn agrietados o Water quality scientist.  Sus [employer]  estn irritadas, sensibles o calientes.  Tiene los siguientes sntomas: ? Dolor en las mamas o en los pezones. ? Un rea hinchada en cualquiera  de las mamas. ? Grant RutsFiebre o escalofros. ? Nuseas o vmitos. ? Drenaje de otro lquido distinto de la WPS Resourcesleche materna desde los pezones.  Sus mamas no se llenan antes de Museum/gallery exhibitions officeramamantar al beb para el quinto da despus del [place].  Se siente triste y deprimida.  El beb: ? Est demasiado somnoliento como para comer bien. ? Tiene problemas para dormir. ? Tiene ms de 1 semana de vida y HCA Incmoja menos de 6 paales en un periodo de 24 horas. ? No ha aumentado de [place]o a los [address] das de [address].  El beb defeca menos de 3 veces en 24 horas.  La piel del beb o las partes blancas de los ojos se vuelven amarillentas. Solicite ayuda de inmediato si:  El beb est muy cansado Retail buyer(letargo) y no se quiere despertar para comer.  Le sube la fiebre sin causa. Resumen  La lactancia materna ofrece muchos beneficios de salud para bebs y [place]madres.  Intente amamantar a su beb cuando muestre signos tempranos de hambre.  Haga cosquillas o empuje suavemente los labios del beb con el dedo o el pezn para lograr que el beb abra la boca. Acerque el beb a la mama. Asegrese de que la mayor parte de la arola se encuentre dentro de la boca del beb. Ofrzcale una mama y haga eructar al beb antes de pasar a la otra.  Hable con su mdico o asesor en lactancia si tiene dudas o problemas con la lactancia. Esta informacin no tiene Theme park managercomo fin reemplazar el consejo del mdico. Asegrese de hacerle al mdico cualquier pregunta que tenga. Document Revised: 08/14/2017 Document Reviewed: 09/09/2016 Elsevier Patient Education  2021 [employer]vier [employer].

## 2020-11-01 ENCOUNTER — Other Ambulatory Visit: Payer: Self-pay

## 2020-11-01 LAB — HEMOGLOBIN A1C
Est. average glucose Bld gHb Est-mCnc: 103 mg/dL
Hgb A1c MFr Bld: 5.2 % (ref 4.8–5.6)

## 2020-11-06 ENCOUNTER — Ambulatory Visit: Payer: Self-pay | Attending: Obstetrics and Gynecology | Admitting: *Deleted

## 2020-11-06 ENCOUNTER — Encounter: Payer: Self-pay | Admitting: *Deleted

## 2020-11-06 ENCOUNTER — Other Ambulatory Visit: Payer: Self-pay | Admitting: *Deleted

## 2020-11-06 ENCOUNTER — Other Ambulatory Visit: Payer: Self-pay

## 2020-11-06 ENCOUNTER — Ambulatory Visit: Payer: Self-pay | Attending: Obstetrics | Admitting: Obstetrics

## 2020-11-06 ENCOUNTER — Ambulatory Visit (HOSPITAL_BASED_OUTPATIENT_CLINIC_OR_DEPARTMENT_OTHER): Payer: Self-pay

## 2020-11-06 DIAGNOSIS — O358XX0 Maternal care for other (suspected) fetal abnormality and damage, not applicable or unspecified: Secondary | ICD-10-CM

## 2020-11-06 DIAGNOSIS — Z3A28 28 weeks gestation of pregnancy: Secondary | ICD-10-CM

## 2020-11-06 DIAGNOSIS — Z98891 History of uterine scar from previous surgery: Secondary | ICD-10-CM

## 2020-11-06 DIAGNOSIS — O09523 Supervision of elderly multigravida, third trimester: Secondary | ICD-10-CM

## 2020-11-06 DIAGNOSIS — O24419 Gestational diabetes mellitus in pregnancy, unspecified control: Secondary | ICD-10-CM

## 2020-11-06 DIAGNOSIS — O0990 Supervision of high risk pregnancy, unspecified, unspecified trimester: Secondary | ICD-10-CM

## 2020-11-06 DIAGNOSIS — O24410 Gestational diabetes mellitus in pregnancy, diet controlled: Secondary | ICD-10-CM

## 2020-11-06 DIAGNOSIS — O99212 Obesity complicating pregnancy, second trimester: Secondary | ICD-10-CM

## 2020-11-06 DIAGNOSIS — Z363 Encounter for antenatal screening for malformations: Secondary | ICD-10-CM

## 2020-11-06 DIAGNOSIS — O35EXX0 Maternal care for other (suspected) fetal abnormality and damage, fetal genitourinary anomalies, not applicable or unspecified: Secondary | ICD-10-CM

## 2020-11-06 DIAGNOSIS — O09522 Supervision of elderly multigravida, second trimester: Secondary | ICD-10-CM

## 2020-11-06 DIAGNOSIS — Z3A22 22 weeks gestation of pregnancy: Secondary | ICD-10-CM

## 2020-11-06 NOTE — Progress Notes (Signed)
MFM Note  This patient was seen for a detailed fetal anatomy scan due to maternal obesity, advanced maternal age, and possible pregestational diabetes.  She has presented late for prenatal care in her current pregnancy.  Her diabetes is currently treated with diet modification.  She has not had any screening tests for fetal aneuploidy drawn in her current pregnancy.  According to a note in Epic, the patient had a limited ultrasound performed earlier in her pregnancy that showed an [facility] of March 06, 2021.  I could not find any official documentation of this ultrasound in her records.  Based on the fetal biometry measurements obtained today, her [facility] is February 23, 2021 making her 24 weeks and 3 days today.  Therefore, we have changed her EDC to February 23, 2021.  Mild right pyelectasis measuring 0.6 cm dilated was noted today.  The left fetal kidney appeared within normal limits.  The patient was reassured that this is most likely a normal variant.  She was reassured that most cases of mild renal pelvis dilatation noted on prenatal ultrasounds will resolve after birth.  The small association of pyelectasis with Down syndrome was discussed.    Due to advanced maternal age and the mild pyelectasis noted today, she was offered and declined an amniocentesis for definitive diagnosis of fetal aneuploidy.  The patient was informed that anomalies may be missed due to technical limitations. If the fetus is in a suboptimal position or maternal habitus is increased, visualization of the fetus in the maternal uterus may be impaired.  The implications and management of diabetes in pregnancy was discussed in detail with the patient. She was advised that our goals for her fingerstick values are fasting values of 90-95 or less and two-hour postprandials of 120 or less.  Should the majority of her fingerstick values be above these values, she may have to be started on insulin or metformin to help her achieve better  glycemic control. The patient was advised that getting her fingerstick values as close to these goals as possible would provide her with the most optimal obstetrical outcome.  Due to early onset gestational diabetes versus pregestational diabetes, the patient was referred to [facility] pediatric cardiology for a fetal echocardiogram.  We will continue to follow her closely with monthly growth ultrasounds.  Weekly fetal testing should be started at around 32 weeks.    A follow-up exam was scheduled in 4 weeks to assess the fetal growth and to complete the views of the fetal anatomy.    The patient stated that all of her questions have been answered to her complete satisfaction.    All conversations were held with the patient today with the help of a Spanish interpreter.  A total of 40 minutes was spent counseling and coordinating the care for this patient.  Greater than 50% of the time was spent in direct face-to-face contact.

## 2020-11-14 ENCOUNTER — Ambulatory Visit: Payer: Self-pay

## 2020-11-14 ENCOUNTER — Telehealth: Payer: Self-pay

## 2020-11-14 ENCOUNTER — Other Ambulatory Visit: Payer: Self-pay

## 2020-11-14 NOTE — Telephone Encounter (Signed)
FETAL ECHO SCHEDULED FOR 11/24/2020.

## 2020-11-15 ENCOUNTER — Telehealth: Payer: Self-pay

## 2020-11-15 NOTE — Telephone Encounter (Signed)
   Regina [name] DOB: 04/23/1986 MRN: [id number]   RIDER WAIVER AND RELEASE OF LIABILITY  For purposes of improving physical access to our facilities, Regina [name] is pleased to partner with third parties to provide Regina [name] patients or other authorized individuals the option of convenient, on-demand ground transportation services (the Chiropractor") through use of the technology service that enables users to request on-demand ground transportation from independent third-party providers.  By opting to use and accept these Regina [name], I, the undersigned, hereby agree on behalf of myself, and on behalf of any minor child using the Regina [name] for whom I am the parent or legal guardian, as follows:  Regina [name] provided to me are provided by independent third-party transportation providers who are not Regina [name] or employees and who are unaffiliated with Regina [name]. Regina [name] is neither a transportation carrier nor a common or public carrier. Regina [name] has no control over the quality or safety of the transportation that occurs as a result of the Regina [name]. Regina [name] cannot guarantee that any third-party transportation provider will complete any arranged transportation service. Regina [name] makes no representation, warranty, or guarantee regarding the reliability, timeliness, quality, safety, suitability, or availability of any of the Transport Services or that they will be error free. I fully understand that traveling by vehicle involves risks and dangers of serious bodily injury, including permanent disability, paralysis, and death. I agree, on behalf of myself and on behalf of any minor child using the Transport Services for whom I am the parent or legal guardian, that the entire risk arising out of my use of the Regina [name] remains solely with me, to the maximum extent permitted under applicable law. The Regina [name] are  provided "as is" and "as available." Regina [name] disclaims all representations and warranties, express, implied or statutory, not expressly set out in these terms, including the implied warranties of merchantability and fitness for a particular purpose. I hereby waive and release Regina [name], its agents, employees, officers, directors, representatives, insurers, attorneys, assigns, successors, subsidiaries, and affiliates from any and all past, present, or future claims, demands, liabilities, actions, causes of action, or suits of any kind directly or indirectly arising from acceptance and use of the Regina [name]. I further waive and release Regina [name] and its affiliates from all present and future liability and responsibility for any injury or death to persons or damages to property caused by or related to the use of the Regina [name]. I have read this Waiver and Release of Liability, and I understand the terms used in it and their legal significance. This Waiver is freely and voluntarily given with the understanding that my right (as well as the right of any minor child for whom I am the parent or legal guardian using the Regina [name]) to legal recourse against Regina [name] in connection with the Regina [name] is knowingly surrendered in return for use of these services.   I attest that I read the consent document to Regina [name], gave Regina [name] the opportunity to ask questions and answered the questions asked (if any). I affirm that Regina [name] then provided consent for she's participation in this program.     Regina [name], read to patient in Regina [name]

## 2020-11-16 ENCOUNTER — Other Ambulatory Visit: Payer: Self-pay

## 2020-11-16 ENCOUNTER — Ambulatory Visit (INDEPENDENT_AMBULATORY_CARE_PROVIDER_SITE_OTHER): Payer: Self-pay | Admitting: Family Medicine

## 2020-11-16 VITALS — BP 108/73 | HR 91 | Wt 234.0 lb

## 2020-11-16 DIAGNOSIS — O24419 Gestational diabetes mellitus in pregnancy, unspecified control: Secondary | ICD-10-CM

## 2020-11-16 DIAGNOSIS — Z98891 History of uterine scar from previous surgery: Secondary | ICD-10-CM

## 2020-11-16 DIAGNOSIS — O0990 Supervision of high risk pregnancy, unspecified, unspecified trimester: Secondary | ICD-10-CM

## 2020-11-16 LAB — POCT URINALYSIS DIP (DEVICE)
Bilirubin Urine: NEGATIVE
Glucose, UA: 100 mg/dL — AB
Hgb urine dipstick: NEGATIVE
Ketones, ur: NEGATIVE mg/dL
Leukocytes,Ua: NEGATIVE
Nitrite: NEGATIVE
Protein, ur: NEGATIVE mg/dL
Specific Gravity, Urine: >=1.03 (ref 1.005–1.030)
Urobilinogen, UA: 0.2 mg/dL (ref 0.0–1.0)
pH: 5.5 (ref 5.0–8.0)

## 2020-11-16 NOTE — Progress Notes (Signed)
   PRENATAL VISIT NOTE  Subjective:  Regina [name] is a 35 y.o. [phone] at [redacted]w[redacted]d being seen today for ongoing prenatal care.  She is currently monitored for the following issues for this high-risk pregnancy and has History of VBAC; Supervision of high risk pregnancy, antepartum; Gestational diabetes mellitus (GDM) affecting fourth pregnancy; and History of cesarean delivery on their problem list.  Patient reports no complaints.  Contractions: Not present. Vag. Bleeding: None.  Movement: Present. Denies leaking of fluid.   The following portions of the patient's history were reviewed and updated as appropriate: allergies, current medications, past family history, past medical history, past social history, past surgical history and problem list.   Objective:   Vitals:   11/16/20 1109  BP: 108/73  Pulse: 91  Weight: 234 lb (106.1 kg)    Fetal Status: Fetal Heart Rate (bpm): 153   Movement: Present     General:  Alert, oriented and cooperative. Patient is in no acute distress.  Skin: Skin is warm and dry. No rash noted.   Cardiovascular: Normal heart rate noted  Respiratory: Normal respiratory effort, no problems with respiration noted  Abdomen: Soft, gravid, appropriate for gestational age.  Pain/Pressure: Absent     Pelvic: Cervical exam deferred        Extremities: Normal range of motion.  Edema: None  Mental Status: Normal mood and affect. Normal behavior. Normal judgment and thought content.   Assessment and Plan:  Pregnancy: [id number] at [redacted]w[redacted]d 1. Supervision of high risk pregnancy, antepartum Up to date No questions today Reports she was sent Medicaid card, requested she brings to front desk to put on file and check on active medicaid  2. Gestational diabetes mellitus (GDM) affecting fourth pregnancy Fastings 60-80s Postpartum all < 120 Encouraged her diet changes and control Continue checking 4 times per da   3. History of cesarean delivery Desires  TOLAC  4. History of VBAC   Preterm labor symptoms and general obstetric precautions including but not limited to vaginal bleeding, contractions, leaking of fluid and fetal movement were reviewed in detail with the patient. Please refer to After Visit Summary for other counseling recommendations.   Return in about 4 weeks (around 12/14/2020) for Routine prenatal care, MD only.  Future Appointments  Date Time Provider Department Center  12/06/2020 10:15 AM [facility] NURSE [facility]  12/06/2020 10:30 AM [facility] US3 [facility]    Federico Flake, MD

## 2020-12-06 ENCOUNTER — Other Ambulatory Visit: Payer: Self-pay

## 2020-12-06 ENCOUNTER — Ambulatory Visit: Payer: Self-pay | Admitting: *Deleted

## 2020-12-06 ENCOUNTER — Other Ambulatory Visit: Payer: Self-pay | Admitting: *Deleted

## 2020-12-06 ENCOUNTER — Encounter: Payer: Self-pay | Admitting: *Deleted

## 2020-12-06 ENCOUNTER — Ambulatory Visit: Payer: Self-pay | Attending: Obstetrics

## 2020-12-06 VITALS — BP 102/62 | HR 85

## 2020-12-06 DIAGNOSIS — O321XX0 Maternal care for breech presentation, not applicable or unspecified: Secondary | ICD-10-CM

## 2020-12-06 DIAGNOSIS — Z3A28 28 weeks gestation of pregnancy: Secondary | ICD-10-CM

## 2020-12-06 DIAGNOSIS — O24410 Gestational diabetes mellitus in pregnancy, diet controlled: Secondary | ICD-10-CM

## 2020-12-06 DIAGNOSIS — O283 Abnormal ultrasonic finding on antenatal screening of mother: Secondary | ICD-10-CM

## 2020-12-06 DIAGNOSIS — O0990 Supervision of high risk pregnancy, unspecified, unspecified trimester: Secondary | ICD-10-CM

## 2020-12-06 DIAGNOSIS — O35EXX0 Maternal care for other (suspected) fetal abnormality and damage, fetal genitourinary anomalies, not applicable or unspecified: Secondary | ICD-10-CM

## 2020-12-06 DIAGNOSIS — Z362 Encounter for other antenatal screening follow-up: Secondary | ICD-10-CM

## 2020-12-06 DIAGNOSIS — O358XX0 Maternal care for other (suspected) fetal abnormality and damage, not applicable or unspecified: Secondary | ICD-10-CM | POA: Insufficient documentation

## 2020-12-06 DIAGNOSIS — O34219 Maternal care for unspecified type scar from previous cesarean delivery: Secondary | ICD-10-CM

## 2020-12-06 DIAGNOSIS — O99213 Obesity complicating pregnancy, third trimester: Secondary | ICD-10-CM

## 2020-12-06 DIAGNOSIS — O0933 Supervision of pregnancy with insufficient antenatal care, third trimester: Secondary | ICD-10-CM

## 2020-12-14 ENCOUNTER — Encounter: Payer: Self-pay | Admitting: Obstetrics and Gynecology

## 2020-12-29 ENCOUNTER — Encounter: Payer: Self-pay | Admitting: Pediatric Cardiology

## 2021-01-02 ENCOUNTER — Ambulatory Visit: Payer: Self-pay

## 2021-01-02 ENCOUNTER — Other Ambulatory Visit: Payer: Self-pay

## 2021-01-04 ENCOUNTER — Ambulatory Visit: Payer: Self-pay | Admitting: *Deleted

## 2021-01-04 ENCOUNTER — Ambulatory Visit: Payer: Self-pay | Attending: Obstetrics and Gynecology

## 2021-01-04 ENCOUNTER — Other Ambulatory Visit: Payer: Self-pay

## 2021-01-04 ENCOUNTER — Encounter: Payer: Self-pay | Admitting: *Deleted

## 2021-01-04 VITALS — BP 90/50 | HR 80

## 2021-01-04 DIAGNOSIS — O0990 Supervision of high risk pregnancy, unspecified, unspecified trimester: Secondary | ICD-10-CM

## 2021-01-04 DIAGNOSIS — O283 Abnormal ultrasonic finding on antenatal screening of mother: Secondary | ICD-10-CM

## 2021-01-04 DIAGNOSIS — Z362 Encounter for other antenatal screening follow-up: Secondary | ICD-10-CM

## 2021-01-04 DIAGNOSIS — O99213 Obesity complicating pregnancy, third trimester: Secondary | ICD-10-CM

## 2021-01-04 DIAGNOSIS — O0933 Supervision of pregnancy with insufficient antenatal care, third trimester: Secondary | ICD-10-CM

## 2021-01-04 DIAGNOSIS — Z3A32 32 weeks gestation of pregnancy: Secondary | ICD-10-CM

## 2021-01-04 DIAGNOSIS — O34219 Maternal care for unspecified type scar from previous cesarean delivery: Secondary | ICD-10-CM

## 2021-01-04 DIAGNOSIS — O24410 Gestational diabetes mellitus in pregnancy, diet controlled: Secondary | ICD-10-CM | POA: Insufficient documentation

## 2021-01-05 ENCOUNTER — Other Ambulatory Visit: Payer: Self-pay | Admitting: *Deleted

## 2021-01-05 DIAGNOSIS — O24419 Gestational diabetes mellitus in pregnancy, unspecified control: Secondary | ICD-10-CM

## 2021-01-10 ENCOUNTER — Encounter: Payer: Self-pay | Admitting: Family Medicine

## 2021-01-10 ENCOUNTER — Other Ambulatory Visit: Payer: Self-pay

## 2021-01-10 ENCOUNTER — Ambulatory Visit (INDEPENDENT_AMBULATORY_CARE_PROVIDER_SITE_OTHER): Payer: Self-pay | Admitting: Family Medicine

## 2021-01-10 ENCOUNTER — Ambulatory Visit: Payer: Self-pay | Admitting: *Deleted

## 2021-01-10 ENCOUNTER — Ambulatory Visit (INDEPENDENT_AMBULATORY_CARE_PROVIDER_SITE_OTHER): Payer: Self-pay

## 2021-01-10 VITALS — BP 116/71 | HR 94 | Wt 231.2 lb

## 2021-01-10 DIAGNOSIS — O0990 Supervision of high risk pregnancy, unspecified, unspecified trimester: Secondary | ICD-10-CM

## 2021-01-10 DIAGNOSIS — O24419 Gestational diabetes mellitus in pregnancy, unspecified control: Secondary | ICD-10-CM

## 2021-01-10 DIAGNOSIS — O99210 Obesity complicating pregnancy, unspecified trimester: Secondary | ICD-10-CM

## 2021-01-10 DIAGNOSIS — Z98891 History of uterine scar from previous surgery: Secondary | ICD-10-CM

## 2021-01-10 LAB — GLUCOSE, CAPILLARY: Glucose-Capillary: 138 mg/dL — ABNORMAL HIGH (ref 70–99)

## 2021-01-10 NOTE — Patient Instructions (Signed)
Eleccin del mtodo anticonceptivo Contraception Choices La anticoncepcin, o los mtodos anticonceptivos, hace referencia a los mtodos o dispositivos que evitan el embarazo. Mtodos hormonales Implante anticonceptivo Un implante anticonceptivo consiste en un tubo delgado de plstico que contiene una hormona que evita el embarazo. Es diferente de un dispositivo intrauterino (DIU). Un mdico lo inserta en la parte superior del brazo. Los implantes pueden ser eficaces durante un mximo de 3 aos. Inyecciones de progestina sola Las inyecciones de progestina sola contienen progestina, una forma sinttica de la hormona progesterona. Un mdico las administra cada 3 meses. Pldoras anticonceptivas Las pldoras anticonceptivas son pastillas que contienen hormonas que evitan el embarazo. Deben tomarse una vez al da, preferentemente a la misma hora cada da. Se necesita una receta para utilizar este mtodo anticonceptivo. Parche anticonceptivo El parche anticonceptivo contiene hormonas que evitan el embarazo. Se coloca en la piel, debe cambiarse una vez a la semana durante tres semanas y debe retirarse en la cuarta semana. Se necesita una receta para utilizar este mtodo anticonceptivo. Anillo vaginal Un anillo vaginal contiene hormonas que evitan el embarazo. Se coloca en la vagina durante tres semanas y se retira en la cuarta semana. Luego se repite el proceso con un anillo nuevo. Se necesita una receta para utilizar este mtodo anticonceptivo. Anticonceptivo de emergencia Los anticonceptivos de emergencia son mtodos para evitar un embarazo despus de tener sexo sin proteccin. Vienen en forma de pldora y pueden tomarse hasta 5 das despus de tener sexo. Funcionan mejor cuando se toman lo ms pronto posible luego de tener sexo. La mayora de los anticonceptivos de emergencia estn disponibles sin receta mdica. Este mtodo no debe utilizarse como el nico mtodo anticonceptivo. Mtodos de  barrera Condn masculino Un condn masculino es una vaina delgada que se coloca sobre el pene durante el sexo. Los condones evitan que el esperma ingrese en el cuerpo de la mujer. Pueden utilizarse con un una sustancia que mata a los espermatozoides (espermicida) para aumentar la efectividad. Deben desecharse despus de un uso. Condn femenino Un condn femenino es una vaina blanda y holgada que se coloca en la vagina antes de tener sexo. El condn evita que el esperma ingrese en el cuerpo de la mujer. Deben desecharse despus de un uso. Diafragma Un diafragma es una barrera blanda con forma de cpula. Se inserta en la vagina antes del sexo, junto con un espermicida. El diafragma bloquea el ingreso de esperma en el tero, y el espermicida mata a los espermatozoides. El diafragma debe permanecer en la vagina durante 6 a 8 horas despus de tener sexo y debe retirarse en el plazo de las 24 horas. Un diafragma es recetado y colocado por un mdico. Debe reemplazarse cada 1 a 2 aos, despus de dar a luz, de aumentar ms de 15lb (6.8kg) y de una ciruga plvica. Capuchn cervical Un capuchn cervical es una copa redonda y blanda de ltex o plstico que se coloca en el cuello uterino. Se inserta en la vagina antes del sexo, junto con un espermicida. Bloquea el ingreso del esperma en el tero. El capuchn debe permanecer en el lugar durante 6 a 8 horas despus de tener sexo y debe retirarse en el plazo de las 48 horas. Un capuchn cervical debe ser recetado y colocado por un mdico. Debe reemplazarse cada 2aos. Esponja Una esponja es una pieza blanda y circular de espuma de poliuretano que contiene espermicida. La esponja ayuda a bloquear el ingreso de esperma en el tero, y el espermicida mata a   los espermatozoides. Para utilizarla, debe humedecerla e insertarla en la vagina. Debe insertarse antes de tener sexo, debe permanecer dentro al menos durante 6 horas despus de tener sexo y debe retirarse y  desecharse en el plazo de las 30 horas. Espermicidas Los espermicidas son sustancias qumicas que matan o bloquean al esperma y no lo dejan ingresar al cuello uterino y al tero. Vienen en forma de crema, gel, supositorio, espuma o comprimido. Un espermicida debe insertarse en la vagina con un aplicador al menos 10 o 15 minutos antes de tener sexo para dar tiempo a que surta efecto. El proceso debe repetirse cada vez que tenga sexo. Los espermicidas no requieren receta mdica. Anticonceptivos intrauterinos Dispositivo intrauterino (DIU) Un DIU es un dispositivo en forma de T que se coloca en el tero. Existen dos tipos: DIU hormonal.Este tipo contiene progestina, una forma sinttica de la hormona progesterona. Este tipo puede permanecer colocado durante 3 a 5 aos. DIU de cobre.Este tipo est recubierto con un alambre de cobre. Puede permanecer colocado durante 10 aos. Mtodos anticonceptivos permanentes Ligadura de trompas en la mujer En este mtodo, se sellan, atan u obstruyen las trompas de Falopio durante una ciruga para evitar que el vulo descienda hacia el tero. Esterilizacin histeroscpica En este mtodo, se coloca un implante pequeo y flexible dentro de cada trompa de Falopio. Los implantes hacen que se forme un tejido cicatricial en las trompas de Falopio y que las obstruya para que el espermatozoide no pueda llegar al vulo. El procedimiento demora alrededor de 3 meses para que sea efectivo. Debe utilizarse otro mtodo anticonceptivo durante esos 3 meses. Esterilizacin masculina Este es un procedimiento que consiste en atar los conductos que transportan el esperma (vasectoma). Luego del procedimiento, el hombre puede eyacular lquido (semen). Debe utilizarse otro mtodo anticonceptivo durante 3 meses despus del procedimiento. Mtodos de planificacin natural Planificacin familiar natural En este mtodo, la pareja no tiene sexo durante los das en que la mujer podra quedar  embarazada. Mtodo calendario En este mtodo, la mujer realiza un seguimiento de la duracin de cada ciclo menstrual, identifica los das en los que se puede producir un embarazo y no tiene sexo durante esos das. Mtodo de la ovulacin En este mtodo, la pareja evita tener sexo durante la ovulacin. Mtodo sintotrmico Este mtodo implica no tener sexo durante la ovulacin. Normalmente, la mujer comprueba la ovulacin al observar cambios en su temperatura y en la consistencia del moco cervical. Mtodo posovulacin En este mtodo, la pareja espera a que finalice la ovulacin para tener sexo. Dnde buscar ms informacin Centers for Disease Control and Prevention (Centros para el Control y la Prevencin de Enfermedades): www.cdc.gov Resumen La anticoncepcin, o los mtodos anticonceptivos, hace referencia a los mtodos o dispositivos que evitan el embarazo. Los mtodos anticonceptivos hormonales incluyen implantes, inyecciones, pastillas, parches, anillos vaginales y anticonceptivos de emergencia. Los mtodos anticonceptivos de barrera pueden incluir condones masculinos, condones femeninos, diafragmas, capuchones cervicales, esponjas y espermicidas. Existen dos tipos de DIU (dispositivo intrauterino). Un DIU puede colocarse en el tero de una mujer para evitar el embarazo durante 3 a 5 aos. La esterilizacin permanente puede realizarse mediante un procedimiento tanto en los hombres como en las mujeres. Los mtodos de planificacin familiar natural implican no tener sexo durante los das en que la mujer podra quedar embarazada. Esta informacin no tiene como fin reemplazar el consejo del mdico. Asegrese de hacerle al mdico cualquier pregunta que tenga. Document Revised: 12/21/2019 Document Reviewed: 12/21/2019 Elsevier Patient Education  2022 Elsevier   Inc.  

## 2021-01-10 NOTE — Progress Notes (Signed)
   Subjective:  Regina [name] is a 35 y.o. [phone] at [redacted]w[redacted]d being seen today for ongoing prenatal care.  She is currently monitored for the following issues for this high-risk pregnancy and has History of VBAC; Supervision of high risk pregnancy, antepartum; Gestational diabetes mellitus (GDM) affecting fourth pregnancy; and History of cesarean delivery on their problem list.  Patient reports no complaints.  Contractions: Irregular. Vag. Bleeding: None.  Movement: Present. Denies leaking of fluid.   The following portions of the patient's history were reviewed and updated as appropriate: allergies, current medications, past family history, past medical history, past social history, past surgical history and problem list. Problem list updated.  Objective:   Vitals:   01/10/21 0948  BP: 116/71  Pulse: 94  Weight: 231 lb 3.2 oz (104.9 kg)    Fetal Status:     Movement: Present     General:  Alert, oriented and cooperative. Patient is in no acute distress.  Skin: Skin is warm and dry. No rash noted.   Cardiovascular: Normal heart rate noted  Respiratory: Normal respiratory effort, no problems with respiration noted  Abdomen: Soft, gravid, appropriate for gestational age. Pain/Pressure: Present     Pelvic: Vag. Bleeding: None     Cervical exam deferred        Extremities: Normal range of motion.  Edema: None  Mental Status: Normal mood and affect. Normal behavior. Normal judgment and thought content.   Urinalysis:      Assessment and Plan:  Pregnancy: G4P3003 at [redacted]w[redacted]d  1. Supervision of high risk pregnancy, antepartum BP normal, NST reactive Needs TDaP, given information for obtaining at [facility]  2. Gestational diabetes mellitus (GDM), antepartum, gestational diabetes method of control unspecified Forgot log Reports she is checking twice a day, fasting and lunch post prandial Initially reported a fasting high of 43, and post prandial of 90 Discussed that these do not  seem to be accurate numbers Finger stick obtained today, 3 hr post prandial and was 138 Strong suspicion she not well controlled Follow up in 1 week for sugar log review Getting weekly antenatal testing and following w MFM, largely normal results but lowish AFI of 7 last week  3. History of VBAC Desires TOLAC, papers signed today  4. Gestational diabetes mellitus (GDM) affecting fourth pregnancy   5. History of cesarean delivery   Preterm labor symptoms and general obstetric precautions including but not limited to vaginal bleeding, contractions, leaking of fluid and fetal movement were reviewed in detail with the patient. Please refer to After Visit Summary for other counseling recommendations.  Return in 2 weeks (on 01/24/2021) for 8/24 or 8/25 [facility], ob visit - has [place] @ MFM @ 0945 on 8/25 *Keep NST/BPP on 8/17.   Venora Maples, MD

## 2021-01-10 NOTE — Progress Notes (Signed)
Korea for growth done 8/4, next scheduled on 9/1

## 2021-01-11 ENCOUNTER — Ambulatory Visit: Payer: Self-pay

## 2021-01-11 ENCOUNTER — Other Ambulatory Visit: Payer: Self-pay

## 2021-01-17 ENCOUNTER — Ambulatory Visit (INDEPENDENT_AMBULATORY_CARE_PROVIDER_SITE_OTHER): Payer: Self-pay | Admitting: Family Medicine

## 2021-01-17 ENCOUNTER — Other Ambulatory Visit: Payer: Self-pay

## 2021-01-17 ENCOUNTER — Ambulatory Visit: Payer: Self-pay | Admitting: *Deleted

## 2021-01-17 ENCOUNTER — Ambulatory Visit (INDEPENDENT_AMBULATORY_CARE_PROVIDER_SITE_OTHER): Payer: Self-pay

## 2021-01-17 ENCOUNTER — Encounter: Payer: Self-pay | Admitting: Family Medicine

## 2021-01-17 VITALS — BP 116/74 | HR 80 | Wt 232.1 lb

## 2021-01-17 DIAGNOSIS — Z98891 History of uterine scar from previous surgery: Secondary | ICD-10-CM

## 2021-01-17 DIAGNOSIS — O99210 Obesity complicating pregnancy, unspecified trimester: Secondary | ICD-10-CM

## 2021-01-17 DIAGNOSIS — O24419 Gestational diabetes mellitus in pregnancy, unspecified control: Secondary | ICD-10-CM

## 2021-01-17 DIAGNOSIS — O0990 Supervision of high risk pregnancy, unspecified, unspecified trimester: Secondary | ICD-10-CM

## 2021-01-17 DIAGNOSIS — Z3A34 34 weeks gestation of pregnancy: Secondary | ICD-10-CM

## 2021-01-17 DIAGNOSIS — O283 Abnormal ultrasonic finding on antenatal screening of mother: Secondary | ICD-10-CM | POA: Insufficient documentation

## 2021-01-17 MED ORDER — METFORMIN HCL 1000 MG PO TABS: 1000.0000 mg | ORAL_TABLET | Freq: Two times a day (BID) | ORAL | 5 refills | Status: DC

## 2021-01-17 NOTE — Progress Notes (Signed)
   Subjective:  Regina [name] is a 35 y.o. [phone] at [redacted]w[redacted]d being seen today for ongoing prenatal care.  She is currently monitored for the following issues for this high-risk pregnancy and has History of VBAC; Supervision of high risk pregnancy, antepartum; Gestational diabetes mellitus (GDM) affecting fourth pregnancy; History of cesarean delivery; and Abnormal fetal ultrasound on their problem list.  Patient reports no complaints.  Contractions: Irregular. Vag. Bleeding: None.  Movement: Present. Denies leaking of fluid.   The following portions of the patient's history were reviewed and updated as appropriate: allergies, current medications, past family history, past medical history, past social history, past surgical history and problem list. Problem list updated.  Objective:   Vitals:   01/17/21 1000  BP: 116/74  Pulse: 80  Weight: 232 lb 1.6 oz (105.3 kg)    Fetal Status: Fetal Heart Rate (bpm): NST   Movement: Present     General:  Alert, oriented and cooperative. Patient is in no acute distress.  Skin: Skin is warm and dry. No rash noted.   Cardiovascular: Normal heart rate noted  Respiratory: Normal respiratory effort, no problems with respiration noted  Abdomen: Soft, gravid, appropriate for gestational age. Pain/Pressure: Present     Pelvic: Vag. Bleeding: None     Cervical exam deferred        Extremities: Normal range of motion.     Mental Status: Normal mood and affect. Normal behavior. Normal judgment and thought content.   Urinalysis:      Assessment and Plan:  Pregnancy: G4P3003 at [redacted]w[redacted]d  1. Supervision of high risk pregnancy, antepartum BP normal NST reactive BPP 10/10, though continues to have low normal AFI Initially reported DFM, resolved during BPP and NST After further discussion more c/w normal changes in late third trimester  2. Gestational diabetes mellitus (GDM), antepartum, gestational diabetes method of control unspecified Brings  sugar log for the past eight days, only has post prandials and no fasting values 1/24 values are at goal, remainder are in 140-180's Discussed importance of also checking fasting sugars. Discussed importance of glucose control for health of pregnancy, reviewed risks of still birth, macrosomia, and attendant delivery complications as well as high likilihood of needing 37wk IOL without significant improvement We discussed insulin vs metformin, she will watch her diet more closely (had white rice for breakfast this morning) and start metformin 1g BID Following with weekly antenatal testing, has been reassuring to date and BPP 10/10 today Has follow up growth in 2 weeks  3. Obesity in pregnancy, antepartum   4. History of cesarean delivery Desires TOLAC, papers signed last visit  5. History of VBAC   6. Gestational diabetes mellitus (GDM) affecting fourth pregnancy   7. Abnormal fetal ultrasound Mild UTD on last [place], per MFM likely normal variant  Preterm labor symptoms and general obstetric precautions including but not limited to vaginal bleeding, contractions, leaking of fluid and fetal movement were reviewed in detail with the patient. Please refer to After Visit Summary for other counseling recommendations.  Return in 2 weeks (on 01/31/2021) for [facility], ob visit, needs MD.   Venora Maples, MD

## 2021-01-17 NOTE — Progress Notes (Signed)
Pt reports decreased FM x2 days.  BPP today = 10/10.  Pt was aware of FM during BPP and NST

## 2021-01-17 NOTE — Patient Instructions (Addendum)
Infeccin por estreptococo del grupo B durante el embarazo Group B Streptococcus Infection During Pregnancy El estreptococo del grupo B (EGB) es un tipo de bacteria que se encuentra a [employer] sanas. Generalmente se encuentra en el recto, la vagina y los intestinos. En personas saludables y en mujeres no embarazadas, rara vez la bacteria provoca una enfermedad o complicaciones graves. Sin embargo, las mujeres [place] de EGB es positiva durante el embarazo pueden transmitirle la bacteria al beb en el parto. Esto puede provocar una infeccin grave en elbeb despus del nacimiento. Las mujeres con EGB tambin pueden tener infecciones durante el Psychiatrist o poco despus del [place]. Las infecciones incluyen infecciones de las vas urinarias (IU) o infecciones del tero. Los EGB tambin [employer] riesgo de complicaciones durante el [place], como parto o trabajo de parto prematuros, aborto espontneo o muerte fetal. Se recomienda que todas las embarazadas sehagan pruebas de rutina para determinar la presencia de EGB. Cules son las causas? Esta afeccin es causada por la bacteria denominada Streptococcus agalactiae. Qu incrementa el riesgo? Puede tener un mayor riesgo de contraer una infeccin por EGB durante elembarazo si ya le ocurri en un embarazo previo. Cules son los signos o sntomas? En la [employer], la infeccin por EGB no causa sntomas en las [place]. Si hay sntomas, estos pueden incluir: Inicio del trabajo de parto antes de la semana 37 de gestacin. Una infeccin urinaria (IU) o en la vejiga. Esto puede causar fiebre, miccin frecuente o dolor y ardor al Geographical information systems officer. Fiebre durante el [place] de [place]. Tambin puede haber latido cardaco rpido en la madre o en el beb. Los sntomas poco frecuentes pero graves de una posible infeccin por EGB en las mujeres incluyen: Infeccin en la sangre (septicemia). Esto puede provocar fiebre, escalofros o  confusin. Infeccin pulmonar (neumona). Esto puede provocar fiebre, escalofros, tos, respiracin rpida, dolor torcico o dificultad para respirar. Infeccin en los huesos, las articulaciones, la piel o los tejidos blandos. Cmo se diagnostica? Le pueden realizar exmenes de deteccin de EGB entre la semana 35 y la semana 37 de gestacin. Si tiene sntomas de trabajo de Sport and exercise psychologist, Fish farm manager los exmenes de deteccin antes. Esta afeccin se diagnostica a travs de los [place] de laboratorio de: Un hisopado del lquido de la vagina y del recto. Lauris Po[place] de [place]. Cmo se trata? Esta afeccin se trata con un antibitico. Le pueden administrar antibiticos: Al comenzar el trabajo de parto o apenas se rompa la bolsa de aguas. El uso de los medicamentos continuar hasta despus del [place]. Si tiene un parto por cesrea no necesita antibiticos, salvo que se haya roto la bolsa de [place]. Para el beb, si necesita tratamiento. El mdico controlar al beb para decidir si necesita antibiticos a fin de prevenir una infeccin grave. Siga estas instrucciones en su casa: Tome los medicamentos de venta libre y los recetados solamente como se lo haya indicado el mdico. Tome su antibitico como se lo haya indicado el mdico. No deje de tomar el antibitico aunque comience a sentirse mejor. Concurra a todas las visitas previas al parto (prenatales) y las visitas de control como se lo haya indicado el mdico. Esto es importante. Comunquese con un mdico si: Siente dolor o ardor al Geographical information systems officer. Tiene que orinar con ms frecuencia de lo habitual. Tiene fiebre o escalofros. Tiene una secrecin vaginal con mal olor. Solicite ayuda de inmediato si: Rompe la bolsa. Comienza el trabajo de [place]. Siente un  dolor intenso en el abdomen. Tiene dificultad para respirar. Siente dolor en el pecho. Estos sntomas pueden representar un problema grave que constituye Radio broadcast assistantuna emergencia. No  espere a ver si los sntomas desaparecen. Solicite atencin mdica de inmediato. Comunquese con el servicio de emergencias de su localidad (911 en los [place]). No conduzca por sus propios medios OfficeMax Incorporatedhasta el hospital. Resumen El EGB es un tipo de bacteria que se encuentra con frecuencia en personas sanas. Durante el embarazo, la colonizacin con EGB puede causar complicaciones graves para usted o el beb. El mdico la examinar entre la semana 35 y 37 de embarazo para Chief Strategy Officerdeterminar si tiene Theatre managercolonizacin de EGB. Si esto sucede Academic librariandurante el embarazo, el mdico le recomendar antibiticos a travs de una va intravenosa durante el Grass Valleytrabajo de Bellechesterparto. Despus del parto, se evaluar al beb para detectar complicaciones relacionadas con una posible infeccin por EGB, lo cual puede requerir antibiticos para prevenir una infeccin grave. Esta informacin no tiene Theme park managercomo fin reemplazar el consejo del mdico. Asegresede hacerle al mdico cualquier pregunta que tenga. Document Revised: 10/06/2019 Document Reviewed: 02/03/2019 Elsevier Patient Education  2022 Elsevier Inc.  Southern CompanyEleccin del mtodo anticonceptivo Contraception Choices La anticoncepcin, o los mtodos anticonceptivos, hace referencia a los mtodoso dispositivos que evitan el [place]embarazo. Mtodos hormonales  Implante anticonceptivo Un implante anticonceptivo consiste en un tubo delgado de plstico que contiene una hormona que evita el [place]embarazo. Es diferente de un dispositivo intrauterino (DIU). Un mdico lo inserta en la parte superior del brazo. Los implantespueden ser eficaces durante un mximo de 3 aos. Inyecciones de progestina sola Las inyecciones de progestina sola contienen progestina, una forma sinttica dela hormona progesterona. Un mdico las administra cada 3 meses. Pldoras anticonceptivas Las pldoras anticonceptivas son pastillas que contienen hormonas que evitan el [place] Valleyembarazo. Deben tomarse una vez al da, preferentemente a la misma hora  cadada. Se necesita una receta para utilizar este mtodo anticonceptivo. Parche anticonceptivo El parche anticonceptivo contiene hormonas que evitan el [place]embarazo. Se coloca en la piel, debe cambiarse una vez a la semana durante tres semanas y debe retirarse en la cuarta semana. Se necesita una receta para Scientific laboratory technicianutilizar este mtodoanticonceptivo. Anillo vaginal Un anillo vaginal contiene hormonas que evitan el embarazo. Se coloca en la vagina durante tres semanas y se retira en la cuarta semana. Luego se repite el proceso con un anillo nuevo. Se necesita una receta para Scientific laboratory technicianutilizar este mtodoanticonceptivo. Anticonceptivo de emergencia Los anticonceptivos de emergencia son mtodos para evitar un embarazo despus de Warehouse managertener sexo sin proteccin. Vienen en forma de pldora y pueden tomarse hasta 5 das despus de Genevatener sexo. Funcionan mejor cuando se toman lo ms pronto posible luego de eBaytener sexo. La mayora de los anticonceptivos de emergencia estn disponibles sin receta mdica. Este mtodo no debe utilizarse como elnico mtodo anticonceptivo. Mtodos de barrera  Condn masculino Un condn masculino es una vaina delgada que se coloca sobre el pene durante el sexo. Los condones evitan que el esperma ingrese en el cuerpo de la [place]. Pueden utilizarse con un una sustancia que mata a los espermatozoides (espermicida) para aumentar la efectividad. Deben desecharse despus de un uso. Condn femenino Un condn femenino es una vaina blanda y holgada que se coloca en la vagina antes de Shelltener sexo. El condn evita que el esperma ingrese en el cuerpo de Insurance claims handlerlamujer. Deben desecharse despus de un uso. Diafragma Un diafragma es una barrera blanda con forma de cpula. Se inserta en la vagina antes del sexo, junto con un espermicida. El diafragma  bloquea el ingreso de esperma en el tero, y el espermicida mata a los espermatozoides. El Designer, fashion/clothing en la vagina durante 6 a 8 horas despus de Warehouse manager sexo y  deberetirarse en el plazo de las 24 horas. Un diafragma es recetado y colocado por un mdico. Debe reemplazarse cada 1 a 2 aos, despus de dar a luz, de aumentar ms de 15 lb (6.8 kg) y de [place]. Capuchn cervical Un capuchn cervical es una copa redonda y blanda de ltex o plstico que se coloca en el cuello uterino. Se inserta en la vagina antes del sexo, junto con un espermicida. Bloquea el ingreso del esperma en el tero. El capuchn Radio producer durante 6 a 8 horas despus de Warehouse manager sexo y debe retirarse en el plazo de las 48 horas. Un capuchn cervical debe ser recetado ycolocado por un mdico. Debe reemplazarse cada 2 aos. Esponja Una esponja es una pieza blanda y circular de espuma de poliuretano que contiene espermicida. La esponja ayuda a bloquear el ingreso de esperma en el tero, y el espermicida mata a los espermatozoides. Belva Bertin, debe humedecerla e insertarla en la vagina. Debe insertarse antes de [employer], debe permanecer dentro al menos durante 6 horas despus de Warehouse manager sexo y deberetirarse y Nurse, adult en el plazo de las 30 horas. Espermicidas Los espermicidas son sustancias qumicas que matan o bloquean al esperma y no lo dejan ingresar al cuello uterino y al tero. Vienen en forma de crema, gel, supositorio, espuma o comprimido. Un espermicida debe insertarse en la vagina con un aplicador al menos 10 o 15 minutos antes de tener sexo para dar tiempo a que surta [address]. El proceso debe repetirse cada vez que tenga sexo. Losespermicidas no requieren receta mdica. Anticonceptivos intrauterinos Dispositivo intrauterino (DIU) Un DIU es un dispositivo en forma de T que se coloca en el tero. Existen dos tipos: DIU hormonal.Este tipo contiene progestina, una forma sinttica de la hormona progesterona. Este tipo puede permanecer colocado durante 3 a 5 aos. DIU de cobre.Este tipo est recubierto con un alambre de cobre. Puede permanecer colocado durante  10 aos. Mtodos anticonceptivos permanentes Ligadura de trompas en la mujer En este mtodo, se sellan, atan u obstruyen las trompas de [place] durante [place] para Automotive engineer que el vulo descienda hacia el tero. Esterilizacin histeroscpica En este mtodo, se coloca un implante pequeo y flexible dentro de cada trompa de Falopio. Los implantes hacen que se forme un tejido cicatricial en las trompas de [place]op[place] y que las obstruya para que el espermatozoide no pueda llegar al vulo. El procedimiento demora alrededor de 3 meses para que seaefectivo. Debe utilizarse otro mtodo anticonceptivo durante esos 3 meses. Esterilizacin masculina Este es un procedimiento que consiste en atar los conductos que transportan el esperma (vasectoma). Luego del procedimiento, el hombre Manufacturing engineer lquido (semen). Debe utilizarse otro mtodo anticonceptivo durante 3 meses despus delprocedimiento. Mtodos de planificacin natural Planificacin familiar natural En este mtodo, la pareja no tiene [employer] la mujer podra quedar [place]. Mtodo calendario En este mtodo, la mujer realiza un seguimiento de la duracin de cada ciclo menstrual, identifica los [employer] que se puede producir un Psychiatrist y no tiene sexo durante esos [address]  [address]. Mtodo de la ovulacin En este mtodo, la pareja evita tener sexo durante la ovulacin. Mtodo sintotrmico Este mtodo implica no tener sexo durante la ovulacin. Normalmente, la mujer comprueba la ovulacinal observar cambios en su temperatura y en la consistencia  del moco cervical. Mtodo posovulacin En este mtodo, la pareja espera a que finalice la ovulacin para Doctor, hospital. Dnde buscar ms informacin Centers for Disease Control and Prevention (Centros para el Control y Psychiatrist de Event organiser): [email or web address] Resumen La anticoncepcin, o los mtodos anticonceptivos, hace referencia a los mtodos o dispositivos que evitan el [place]. Los  mtodos anticonceptivos hormonales incluyen implantes, inyecciones, pastillas, parches, anillos vaginales y anticonceptivos de Associate Professor. Los mtodos anticonceptivos de barrera pueden incluir condones masculinos, condones femeninos, diafragmas, capuchones cervicales, esponjas y espermicidas. [employer] tipos de DIU (dispositivo intrauterino). Un DIU puede colocarse en el tero de una mujer para evitar el embarazo durante 3 a 5 aos. La esterilizacin permanente puede realizarse mediante un procedimiento tanto en los hombres como en las mujeres. Los [employer] de Medical sales representative natural implican no tener [employer] Insurance la mujer podra quedar [place]. Esta informacin no tiene Theme park manager el consejo del mdico. Asegresede hacerle al mdico cualquier pregunta que tenga. Document Revised: 12/21/2019 Document Reviewed: 12/21/2019 Elsevier Patient Education  2022 [employer].

## 2021-01-18 ENCOUNTER — Other Ambulatory Visit: Payer: Self-pay

## 2021-01-18 ENCOUNTER — Ambulatory Visit: Payer: Self-pay

## 2021-01-25 ENCOUNTER — Ambulatory Visit: Payer: Self-pay | Attending: Obstetrics

## 2021-01-25 ENCOUNTER — Ambulatory Visit (INDEPENDENT_AMBULATORY_CARE_PROVIDER_SITE_OTHER): Payer: Self-pay | Admitting: Obstetrics and Gynecology

## 2021-01-25 ENCOUNTER — Encounter: Payer: Self-pay | Admitting: *Deleted

## 2021-01-25 ENCOUNTER — Ambulatory Visit: Payer: Self-pay | Admitting: *Deleted

## 2021-01-25 ENCOUNTER — Other Ambulatory Visit (HOSPITAL_COMMUNITY)
Admission: RE | Admit: 2021-01-25 | Discharge: 2021-01-25 | Disposition: A | Discharge status: Home / Self Care | Payer: Self-pay | Source: Ambulatory Visit | Attending: Obstetrics and Gynecology | Admitting: Obstetrics and Gynecology

## 2021-01-25 ENCOUNTER — Encounter: Payer: Self-pay | Admitting: Obstetrics and Gynecology

## 2021-01-25 ENCOUNTER — Other Ambulatory Visit: Payer: Self-pay

## 2021-01-25 VITALS — BP 104/72 | HR 93 | Wt 232.9 lb

## 2021-01-25 VITALS — BP 97/71 | HR 91

## 2021-01-25 DIAGNOSIS — Z3A35 35 weeks gestation of pregnancy: Secondary | ICD-10-CM

## 2021-01-25 DIAGNOSIS — O24419 Gestational diabetes mellitus in pregnancy, unspecified control: Secondary | ICD-10-CM | POA: Insufficient documentation

## 2021-01-25 DIAGNOSIS — O0990 Supervision of high risk pregnancy, unspecified, unspecified trimester: Secondary | ICD-10-CM | POA: Insufficient documentation

## 2021-01-25 DIAGNOSIS — E669 Obesity, unspecified: Secondary | ICD-10-CM

## 2021-01-25 DIAGNOSIS — O99213 Obesity complicating pregnancy, third trimester: Secondary | ICD-10-CM

## 2021-01-25 DIAGNOSIS — O283 Abnormal ultrasonic finding on antenatal screening of mother: Secondary | ICD-10-CM

## 2021-01-25 DIAGNOSIS — Z98891 History of uterine scar from previous surgery: Secondary | ICD-10-CM

## 2021-01-25 DIAGNOSIS — O24415 Gestational diabetes mellitus in pregnancy, controlled by oral hypoglycemic drugs: Secondary | ICD-10-CM

## 2021-01-25 NOTE — Progress Notes (Signed)
   PRENATAL VISIT NOTE  Subjective:  Regina [name] is a 35 y.o. [phone] at [redacted]w[redacted]d being seen today for ongoing prenatal care.  She is currently monitored for the following issues for this high-risk pregnancy and has History of VBAC; Supervision of high risk pregnancy, antepartum; Gestational diabetes mellitus (GDM) affecting fourth pregnancy; History of cesarean delivery; and Abnormal fetal ultrasound on their problem list.  Patient reports backache.  Contractions: Not present. Vag. Bleeding: None.  Movement: Present. Denies leaking of fluid.   The following portions of the patient's history were reviewed and updated as appropriate: allergies, current medications, past family history, past medical history, past social history, past surgical history and problem list.   Objective:   Vitals:   01/25/21 0858  BP: 104/72  Pulse: 93  Weight: 232 lb 14.4 oz (105.6 kg)    Fetal Status: Fetal Heart Rate (bpm): 151   Movement: Present  Presentation: Vertex  General:  Alert, oriented and cooperative. Patient is in no acute distress.  Skin: Skin is warm and dry. No rash noted.   Cardiovascular: Normal heart rate noted  Respiratory: Normal respiratory effort, no problems with respiration noted  Abdomen: Soft, gravid, appropriate for gestational age.  Pain/Pressure: Present     Pelvic: Cervical exam performed in the presence of a chaperone Dilation: Closed Effacement (%): Thick Station: Ballotable  Extremities: Normal range of motion.  Edema: None  Mental Status: Normal mood and affect. Normal behavior. Normal judgment and thought content.   Assessment and Plan:  Pregnancy: G4P3003 at [redacted]w[redacted]d 1. Supervision of high risk pregnancy, antepartum Patient is doing well without complaints Cultures today  2. Gestational diabetes mellitus (GDM) affecting fourth pregnancy Patient brought CBG log with 2 values fasting 100 and 1 value post breakfast 100. She verbally reports a fasting range  89-115 and pp as high as 135 on a separate sheet which she did not bring Patient reports taking metformin as prescribed Stressed importance of monitoring CBGs 4 times daily. Discussed initiation of insulin and patient declined. Discussed IOL at 37 weeks and patient agreed Follow up MFM scan today  3. History of cesarean delivery Desires TOLAC  Preterm labor symptoms and general obstetric precautions including but not limited to vaginal bleeding, contractions, leaking of fluid and fetal movement were reviewed in detail with the patient. Please refer to After Visit Summary for other counseling recommendations.   Return in about 1 week (around 02/01/2021) for in person, ROB, High risk.  Future Appointments  Date Time Provider Department Center  01/25/2021  9:45 AM [facility] NURSE [facility]  01/25/2021 10:00 AM [facility]  02/01/2021 10:30 AM [facility] NURSE [facility]  02/01/2021 10:45 AM [facility] US4 [facility]    Catalina Antigua, MD

## 2021-01-26 LAB — CERVICOVAGINAL ANCILLARY ONLY
Chlamydia: NEGATIVE
Comment: NEGATIVE
Comment: NORMAL
Neisseria Gonorrhea: NEGATIVE

## 2021-01-29 ENCOUNTER — Encounter: Payer: Self-pay | Admitting: *Deleted

## 2021-01-29 LAB — CULTURE, BETA STREP (GROUP B ONLY): Strep Gp B Culture: NEGATIVE

## 2021-01-30 ENCOUNTER — Other Ambulatory Visit: Payer: Self-pay

## 2021-01-30 ENCOUNTER — Ambulatory Visit (INDEPENDENT_AMBULATORY_CARE_PROVIDER_SITE_OTHER): Payer: Self-pay | Admitting: Family Medicine

## 2021-01-30 ENCOUNTER — Encounter: Payer: Self-pay | Admitting: Family Medicine

## 2021-01-30 VITALS — BP 110/78 | HR 85 | Wt 232.8 lb

## 2021-01-30 DIAGNOSIS — O0990 Supervision of high risk pregnancy, unspecified, unspecified trimester: Secondary | ICD-10-CM

## 2021-01-30 DIAGNOSIS — O283 Abnormal ultrasonic finding on antenatal screening of mother: Secondary | ICD-10-CM

## 2021-01-30 DIAGNOSIS — Z98891 History of uterine scar from previous surgery: Secondary | ICD-10-CM

## 2021-01-30 DIAGNOSIS — O24419 Gestational diabetes mellitus in pregnancy, unspecified control: Secondary | ICD-10-CM

## 2021-01-30 DIAGNOSIS — Z3A36 36 weeks gestation of pregnancy: Secondary | ICD-10-CM

## 2021-01-30 NOTE — Progress Notes (Signed)
   Subjective:  Regina [name] is a 35 y.o. [phone] at [redacted]w[redacted]d being seen today for ongoing prenatal care.  She is currently monitored for the following issues for this high-risk pregnancy and has History of VBAC; Supervision of high risk pregnancy, antepartum; Gestational diabetes mellitus (GDM) affecting fourth pregnancy; History of cesarean delivery; and Abnormal fetal ultrasound on their problem list.  Patient reports no complaints.  Contractions: Not present. Vag. Bleeding: None.  Movement: Present. Denies leaking of fluid.   The following portions of the patient's history were reviewed and updated as appropriate: allergies, current medications, past family history, past medical history, past social history, past surgical history and problem list. Problem list updated.  Objective:   Vitals:   01/30/21 1324  BP: 110/78  Pulse: 85  Weight: 232 lb 12.8 oz (105.6 kg)    Fetal Status: Fetal Heart Rate (bpm): 155   Movement: Present     General:  Alert, oriented and cooperative. Patient is in no acute distress.  Skin: Skin is warm and dry. No rash noted.   Cardiovascular: Normal heart rate noted  Respiratory: Normal respiratory effort, no problems with respiration noted  Abdomen: Soft, gravid, appropriate for gestational age. Pain/Pressure: Present     Pelvic: Vag. Bleeding: None     Cervical exam deferred        Extremities: Normal range of motion.  Edema: None  Mental Status: Normal mood and affect. Normal behavior. Normal judgment and thought content.    Urinalysis:      Assessment and Plan:  Pregnancy: G4P3003 at [redacted]w[redacted]d  1. Supervision of high risk pregnancy, antepartum BP and FHR normal  2. Gestational diabetes mellitus (GDM) affecting fourth pregnancy Essentially 0% of sugars at goal (see picture, first column is fasting, the others are lunch/dinner post prandials) Already scheduled for IOL at 37 wks Has follow up growth scan day before induction  3. History  of VBAC Previously signed VBAC consent  4. Abnormal fetal ultrasound Mild UTD, follow up postnatally  Preterm labor symptoms and general obstetric precautions including but not limited to vaginal bleeding, contractions, leaking of fluid and fetal movement were reviewed in detail with the patient. Please refer to After Visit Summary for other counseling recommendations.  Return in about 7 weeks (around 03/20/2021) for PP check.   Venora Maples, MD

## 2021-01-30 NOTE — Progress Notes (Signed)
Patient complains of "tightening of the belly"

## 2021-02-01 ENCOUNTER — Ambulatory Visit: Payer: Medicaid Other | Admitting: *Deleted

## 2021-02-01 ENCOUNTER — Ambulatory Visit: Payer: Medicaid Other | Attending: Obstetrics

## 2021-02-01 ENCOUNTER — Other Ambulatory Visit: Payer: Self-pay

## 2021-02-01 ENCOUNTER — Encounter: Payer: Self-pay | Admitting: *Deleted

## 2021-02-01 VITALS — BP 101/58 | HR 86

## 2021-02-01 DIAGNOSIS — O0990 Supervision of high risk pregnancy, unspecified, unspecified trimester: Secondary | ICD-10-CM

## 2021-02-01 DIAGNOSIS — Z3A36 36 weeks gestation of pregnancy: Secondary | ICD-10-CM

## 2021-02-01 DIAGNOSIS — O24419 Gestational diabetes mellitus in pregnancy, unspecified control: Secondary | ICD-10-CM | POA: Insufficient documentation

## 2021-02-01 DIAGNOSIS — E669 Obesity, unspecified: Secondary | ICD-10-CM

## 2021-02-01 DIAGNOSIS — O99213 Obesity complicating pregnancy, third trimester: Secondary | ICD-10-CM

## 2021-02-02 ENCOUNTER — Inpatient Hospital Stay (HOSPITAL_COMMUNITY): Payer: Medicaid Other | Admitting: Anesthesiology

## 2021-02-02 ENCOUNTER — Inpatient Hospital Stay (HOSPITAL_COMMUNITY): Payer: Medicaid Other

## 2021-02-02 ENCOUNTER — Encounter (HOSPITAL_COMMUNITY): Payer: Self-pay | Admitting: Obstetrics and Gynecology

## 2021-02-02 ENCOUNTER — Inpatient Hospital Stay (HOSPITAL_COMMUNITY)
Admission: AD | Admit: 2021-02-02 | Discharge: 2021-02-05 | DRG: 807 | Disposition: A | Discharge status: Home / Self Care | Payer: Medicaid Other | Attending: Obstetrics and Gynecology | Admitting: Obstetrics and Gynecology

## 2021-02-02 DIAGNOSIS — O0990 Supervision of high risk pregnancy, unspecified, unspecified trimester: Secondary | ICD-10-CM

## 2021-02-02 DIAGNOSIS — O34211 Maternal care for low transverse scar from previous cesarean delivery: Secondary | ICD-10-CM | POA: Diagnosis not present

## 2021-02-02 DIAGNOSIS — O283 Abnormal ultrasonic finding on antenatal screening of mother: Secondary | ICD-10-CM

## 2021-02-02 DIAGNOSIS — Z3A37 37 weeks gestation of pregnancy: Secondary | ICD-10-CM | POA: Diagnosis not present

## 2021-02-02 DIAGNOSIS — Z3490 Encounter for supervision of normal pregnancy, unspecified, unspecified trimester: Secondary | ICD-10-CM | POA: Diagnosis present

## 2021-02-02 DIAGNOSIS — O24425 Gestational diabetes mellitus in childbirth, controlled by oral hypoglycemic drugs: Principal | ICD-10-CM | POA: Diagnosis present

## 2021-02-02 DIAGNOSIS — O34219 Maternal care for unspecified type scar from previous cesarean delivery: Secondary | ICD-10-CM | POA: Diagnosis present

## 2021-02-02 DIAGNOSIS — O24419 Gestational diabetes mellitus in pregnancy, unspecified control: Secondary | ICD-10-CM | POA: Diagnosis present

## 2021-02-02 DIAGNOSIS — O99214 Obesity complicating childbirth: Secondary | ICD-10-CM | POA: Diagnosis present

## 2021-02-02 DIAGNOSIS — Z20822 Contact with and (suspected) exposure to covid-19: Secondary | ICD-10-CM | POA: Diagnosis present

## 2021-02-02 HISTORY — DX: Type 2 diabetes mellitus without complications: E11.9

## 2021-02-02 LAB — RPR: RPR Ser Ql: NONREACTIVE

## 2021-02-02 LAB — CBC
HCT: 37.8 % (ref 36.0–46.0)
Hemoglobin: 12.5 g/dL (ref 12.0–15.0)
MCH: 28.9 pg (ref 26.0–34.0)
MCHC: 33.1 g/dL (ref 30.0–36.0)
MCV: 87.5 fL (ref 80.0–100.0)
Platelets: 218 10*3/uL (ref 150–400)
RBC: 4.32 MIL/uL (ref 3.87–5.11)
RDW: 13.1 % (ref 11.5–15.5)
WBC: 7.9 10*3/uL (ref 4.0–10.5)
nRBC: 0 % (ref 0.0–0.2)

## 2021-02-02 LAB — GLUCOSE, CAPILLARY
Glucose-Capillary: 127 mg/dL — ABNORMAL HIGH (ref 70–99)
Glucose-Capillary: 113 mg/dL — ABNORMAL HIGH (ref 70–99)
Glucose-Capillary: 88 mg/dL (ref 70–99)
Glucose-Capillary: 130 mg/dL — ABNORMAL HIGH (ref 70–99)
Glucose-Capillary: 132 mg/dL — ABNORMAL HIGH (ref 70–99)

## 2021-02-02 LAB — RESP PANEL BY RT-PCR (FLU A&B, COVID) ARPGX2
Influenza A by PCR: NEGATIVE
Influenza B by PCR: NEGATIVE
SARS Coronavirus 2 by RT PCR: NEGATIVE

## 2021-02-02 LAB — TYPE AND SCREEN
ABO/RH(D): O POS
Antibody Screen: NEGATIVE

## 2021-02-02 MED ORDER — PHENYLEPHRINE 40 MCG/ML (10ML) SYRINGE FOR IV PUSH (FOR BLOOD PRESSURE SUPPORT): 80.0000 ug | PREFILLED_SYRINGE | INTRAVENOUS | Status: DC | PRN

## 2021-02-02 MED ORDER — OXYCODONE-ACETAMINOPHEN 5-325 MG PO TABS: 2.0000 | ORAL_TABLET | ORAL | Status: DC | PRN

## 2021-02-02 MED ORDER — OXYCODONE-ACETAMINOPHEN 5-325 MG PO TABS: 1.0000 | ORAL_TABLET | ORAL | Status: DC | PRN

## 2021-02-02 MED ORDER — LIDOCAINE HCL (PF) 1 % IJ SOLN: 30.0000 mL | INTRAMUSCULAR | Status: DC | PRN

## 2021-02-02 MED ORDER — PHENYLEPHRINE 40 MCG/ML (10ML) SYRINGE FOR IV PUSH (FOR BLOOD PRESSURE SUPPORT)
80.0000 ug | PREFILLED_SYRINGE | INTRAVENOUS | Status: DC | PRN
  Filled 2021-02-02: qty 10

## 2021-02-02 MED ORDER — LACTATED RINGERS IV SOLN: 500.0000 mL | Freq: Once | INTRAVENOUS | Status: DC

## 2021-02-02 MED ORDER — FENTANYL-BUPIVACAINE-NACL 0.5-0.125-0.9 MG/250ML-% EP SOLN
12.0000 mL/h | EPIDURAL | Status: DC | PRN
  Administered 2021-02-03: 12 mL/h via EPIDURAL
  Filled 2021-02-02: qty 250

## 2021-02-02 MED ORDER — EPHEDRINE 5 MG/ML INJ: 10.0000 mg | INTRAVENOUS | Status: DC | PRN

## 2021-02-02 MED ORDER — FENTANYL CITRATE (PF) 100 MCG/2ML IJ SOLN
100.0000 ug | INTRAMUSCULAR | Status: DC | PRN
  Administered 2021-02-02: 100 ug via INTRAVENOUS
  Filled 2021-02-02 (×2): qty 2

## 2021-02-02 MED ORDER — TERBUTALINE SULFATE 1 MG/ML IJ SOLN: 0.2500 mg | Freq: Once | INTRAMUSCULAR | Status: DC | PRN

## 2021-02-02 MED ORDER — INSULIN ASPART 100 UNIT/ML IJ SOLN: 0.0000 [IU] | INTRAMUSCULAR | Status: DC

## 2021-02-02 MED ORDER — SOD CITRATE-CITRIC ACID 500-334 MG/5ML PO SOLN: 30.0000 mL | ORAL | Status: DC | PRN

## 2021-02-02 MED ORDER — OXYTOCIN-SODIUM CHLORIDE 30-0.9 UT/500ML-% IV SOLN: 2.5000 [IU]/h | INTRAVENOUS | Status: DC

## 2021-02-02 MED ORDER — LACTATED RINGERS IV SOLN: INTRAVENOUS | Status: DC

## 2021-02-02 MED ORDER — OXYTOCIN BOLUS FROM INFUSION: 333.0000 mL | Freq: Once | INTRAVENOUS | Status: DC

## 2021-02-02 MED ORDER — LACTATED RINGERS IV SOLN: 500.0000 mL | INTRAVENOUS | Status: DC | PRN

## 2021-02-02 MED ORDER — DIPHENHYDRAMINE HCL 50 MG/ML IJ SOLN: 12.5000 mg | INTRAMUSCULAR | Status: DC | PRN

## 2021-02-02 MED ORDER — ONDANSETRON HCL 4 MG/2ML IJ SOLN
4.0000 mg | Freq: Four times a day (QID) | INTRAMUSCULAR | Status: DC | PRN
  Administered 2021-02-02: 4 mg via INTRAVENOUS
  Filled 2021-02-02: qty 2

## 2021-02-02 MED ORDER — OXYTOCIN-SODIUM CHLORIDE 30-0.9 UT/500ML-% IV SOLN: 1.0000 m[IU]/min | INTRAVENOUS | Status: DC

## 2021-02-02 MED ORDER — OXYTOCIN-SODIUM CHLORIDE 30-0.9 UT/500ML-% IV SOLN
1.0000 m[IU]/min | INTRAVENOUS | Status: DC
  Administered 2021-02-02: 2 m[IU]/min via INTRAVENOUS
  Filled 2021-02-02: qty 500

## 2021-02-02 MED ORDER — ACETAMINOPHEN 325 MG PO TABS: 650.0000 mg | ORAL_TABLET | ORAL | Status: DC | PRN

## 2021-02-02 NOTE — Progress Notes (Addendum)
Labor Progress Note Regina [name] is a 35 y.o. [phone] at [redacted]w[redacted]d presented for IOL due to poorly controlled A2GDM.   S: Resting, not feeling much.   O:  BP 110/67   Pulse 81   Temp 98 F (36.7 C) (Oral)   Resp 16   Ht 5\' 2"  (1.575 m)   Wt 106.4 kg   LMP 04/21/2020   SpO2 98%   BMI 42.89 kg/m  EFM: 150/mod/10x10/none   CVE: Dilation: 1.5 Effacement (%): 50 Station: -2, -3 Presentation: Vertex Exam by:: Isiah Scheel   A&P: 35 y.o. 30 [redacted]w[redacted]d.  #Labor: Still has FB in place, not feeling much quite yet. Hopeful FB will fall out soon, will go ahead and start pit 1x1.  #Pain: PRN  #FWB: Cat 1  #GBS negative  #A2GDM: Recent CBG 113. Will monitor.   [redacted]w[redacted]d, DO 2:36 PM

## 2021-02-02 NOTE — Progress Notes (Signed)
Regina [name] is a 35 y.o. [phone] at [redacted]w[redacted]d  admitted for induction of labor/TOLAC for GDMA2(uncontrolled).  Subjective: Contractions feel stronger. Thinks she may want something for pain. Deciding between epidural and IV pain medications Objective: BP 134/83   Pulse 94   Temp 97.7 F (36.5 C) (Oral)   Resp 16   Ht 5\' 2"  (1.575 m)   Wt 106.4 kg   LMP 04/21/2020   SpO2 98%   BMI 42.89 kg/m  No intake/output data recorded. No intake/output data recorded.  FHT:  FHR: 150 bpm, variability: moderate,  accelerations:  Present,  decelerations:  Absent UC:   not tracing on monitor - every 2-3 mins SVE:   Dilation: 8 Effacement (%): 90 Station: -1 Exam by:: Dr. [email or web address]  Labs: Lab Results  Component Value Date   WBC 7.9 02/02/2021   HGB 12.5 02/02/2021   HCT 37.8 02/02/2021   MCV 87.5 02/02/2021   PLT 218 02/02/2021    Assessment / Plan: Induction of labor due to GDMA2 (not well controlled),  progressing well on pitocin  Labor: Progressing on Pitocin, will continue to increase then AROM Fetal Wellbeing:  Category I Pain Control:   deciding between epidural and IV fentanyl I/D:   GBS negative Anticipated MOD:  NSVD  #GDM Switched to q2hr checks and SSI ordered  04/04/2021 02/02/2021, 11:12 PM

## 2021-02-02 NOTE — H&P (Signed)
OBSTETRIC ADMISSION HISTORY AND PHYSICAL  Regina [name] is a 35 y.o. female [phone] with IUP at [redacted]w[redacted]d by 24 wk [place] presenting for IOL due to poorly controlled A2GDM. She reports +FMs, No LOF, no VB, no blurry vision, headaches or peripheral edema, and RUQ pain.  She plans on breast and bottle feeding. She request patch for birth control. She received her prenatal care at [facility]   Dating: By 24 week [place] per MFM --->  Estimated Date of Delivery: 02/23/21  Sono:    @[redacted]w[redacted]d , CWD, normal anatomy, cephalic presentation,  3178g, 68% EFW   Prenatal History/Complications:  --A2GDM poorly controlled diabetes on metformin  --Mild UTD on fetal ultrasound  --Previous C/S due "umbilical cord around baby's neck" in 2009 --Two successful VBACs   Past Medical History: Past Medical History:  Diagnosis Date   Gestational Diabetes    Medical history non-contributory     Past Surgical History: Past Surgical History:  Procedure Laterality Date   CESAREAN SECTION      Obstetrical History: OB History     Gravida  4   Para  3   Term  3   Preterm  0   AB  0   Living  3      SAB  0   IAB  0   Ectopic  0   Multiple  0   Live Births  1           Social History Social History   Socioeconomic History   Marital status: 2010    Spouse name: Media planner   Number of children: Not on file   Years of education: Not on file   Highest education level: Not on file  Occupational History   Not on file  Tobacco Use   Smoking status: Never   Smokeless tobacco: Never  Vaping Use   Vaping Use: Never used  Substance and Sexual Activity   Alcohol use: No   Drug use: No   Sexual activity: Yes    Birth control/protection: Patch  Other Topics Concern   Not on file  Social History Narrative   Not on file   Social Determinants of Health   Financial Resource Strain: Not on file  Food Insecurity: No Food Insecurity   Worried About Rich Reining in  the Last Year: Never true   Ran Out of Food in the Last Year: Never true  Transportation Needs: Unmet Transportation Needs   Lack of Transportation (Medical): Yes   Lack of Transportation (Non-Medical): Yes  Physical Activity: Not on file  Stress: Not on file  Social Connections: Not on file    Family History: Family History  Problem Relation Age of Onset   Heart disease Mother    Hypertension Mother    Diabetes Mother    Hyperlipidemia Mother    Kidney disease Father     Allergies: No Known Allergies  Medications Prior to Admission  Medication Sig Dispense Refill Last Dose   metFORMIN (GLUCOPHAGE) 1000 MG tablet Take 1 tablet (1,000 mg total) by mouth 2 (two) times daily with a meal. 60 tablet 5 02/02/2021   Prenatal Vit-Fe Fumarate-FA (PRENATAL VITAMIN PO) Take by mouth.   02/02/2021     Review of Systems   All systems reviewed and negative except as stated in HPI  Blood pressure 110/67, pulse 81, temperature 98 F (36.7 C), temperature source Oral, resp. rate 16, height 5\' 2"  (1.575 m), weight 106.4 kg, last menstrual period  04/21/2020, SpO2 98 %, currently breastfeeding. General appearance: alert, cooperative, and no distress Lungs: clear to auscultation bilaterally Heart: regular rate and rhythm Abdomen: soft, non-tender; bowel sounds normal Pelvic: NEFG Extremities: Homans sign is negative, no sign of DVT Presentation: cephalic Fetal monitoringBaseline: 145 bpm, Variability: Good {> 6 bpm), Accelerations: Reactive, and Decelerations: Absent Uterine activityNone Dilation: 1 Effacement (%): 50 Station: -3 Exam by:: Ronney Lion RN   Prenatal labs: ABO, Rh: --/--/O POS (09/02 0645) Antibody: NEG (09/02 0645) Rubella: Immune (05/12 0000) RPR: Nonreactive (05/12 0000)  HBsAg: Negative (05/12 0000)  HIV: Non-reactive (05/12 0000)  GBS: Negative/-- (08/25 0942)  3 hr Glucola failed (202, 195, 157) Genetic screening declined Anatomy US normal female   Prenatal  Transfer Tool  Maternal Diabetes: Yes:  Diabetes Type:  Insulin/Medication controlled Genetic Screening: Declined Maternal Ultrasounds/Referrals: Normal Fetal Ultrasounds or other Referrals:  Fetal echo Maternal Substance Abuse:  No Significant Maternal Medications:  None Significant Maternal Lab Results: Group B Strep negative  Results for orders placed or performed during the hospital encounter of 02/02/21 (from the past 24 hour(s))  CBC   Collection Time: 02/02/21  6:45 AM  Result Value Ref Range   WBC 7.9 4.0 - 10.5 K/uL   RBC 4.32 3.87 - 5.11 MIL/uL   Hemoglobin 12.5 12.0 - 15.0 g/dL   HCT 25.4 27.0 - 62.3 %   MCV 87.5 80.0 - 100.0 fL   MCH 28.9 26.0 - 34.0 pg   MCHC 33.1 30.0 - 36.0 g/dL   RDW 76.2 83.1 - 51.7 %   Platelets 218 150 - 400 K/uL   nRBC 0.0 0.0 - 0.2 %  Type and screen   Collection Time: 02/02/21  6:45 AM  Result Value Ref Range   ABO/RH(D) O POS    Antibody Screen NEG    Sample Expiration      02/05/2021,2359 Performed at [facility], [address] [place], [place] [address]   Resp Panel by RT-PCR (Flu A&B, Covid) Nasopharyngeal Swab   Collection Time: 02/02/21  6:49 AM   Specimen: Nasopharyngeal Swab; Nasopharyngeal(NP) swabs in vial transport medium  Result Value Ref Range   SARS Coronavirus 2 by RT PCR NEGATIVE NEGATIVE   Influenza A by PCR NEGATIVE NEGATIVE   Influenza B by PCR NEGATIVE NEGATIVE  Glucose, capillary   Collection Time: 02/02/21  6:57 AM  Result Value Ref Range   Glucose-Capillary 127 (H) 70 - 99 mg/dL    Patient Active Problem List   Diagnosis Date Noted   Encounter for induction of labor 02/02/2021   Abnormal fetal ultrasound 01/17/2021   Supervision of high risk pregnancy, antepartum 10/31/2020   Gestational diabetes mellitus (GDM) affecting fourth pregnancy 10/31/2020   History of cesarean delivery 10/31/2020   History of VBAC 11/18/2013    Assessment/Plan:  Regina [name] is a 35 y.o. [phone] at  [redacted]w[redacted]d here for IOL due to poorly controlled A2GDM.   #Labor: Induction with FB. After verbal consent, placed FB and patient tolerated procedure well.  #Pain: PRN #FWB: Cat 1  #ID: GBS negative  #MOF: breast and bottle  #MOC: patch  #Circ: No  #A2GDM: Failed early screening around 20 weeks, suspect may have had pre-existed DM/insulin resistance. Poorly controlled, on metformin BID. EFW 68% 3178gm on 9/1. Pelvis tested to 3524g.  #TOLAC: Successful VBAC x2, previous C/s for NRFHTs. Will be mindful with delivery.   #Fetal UTD: Seen on [place]. Peds to be aware for follow up.  Allayne Stack, DO  02/02/2021, 8:43 AM

## 2021-02-02 NOTE — Progress Notes (Signed)
Labor Progress Note Regina [name] is a 35 y.o. [phone] at [redacted]w[redacted]d presented for IOL due to poorly controlled A2GDM.   S: Starting feeling uncomfortable, getting IV pain medication.   O:  BP (!) 114/59   Pulse 82   Temp 97.7 F (36.5 C) (Oral)   Resp 16   Ht 5\' 2"  (1.575 m)   Wt 106.4 kg   LMP 04/21/2020   SpO2 98%   BMI 42.89 kg/m  EFM: 150/mod/15x15/none  CVE: Dilation: 5 Effacement (%): 70 Cervical Position: Posterior Station: -3 Presentation: Vertex Exam by:: S. [email or web address]. RN   A&P: 35 y.o. 30 [redacted]w[redacted]d. #Labor: Progressing well s/p FB. Starting to feel more contractions with the pit titration and making cervical change. Last check by RN 5/70 and still intact, plan to recheck shortly and AROM, likely IUPC placement at that time as well with difficulty tracing contractions.  #Pain: PRN #FWB: Cat 1  #GBS negative  #A2GDM: Most recent CBG 88>130 after eating/drinking something sweet. If elevated again, can add SSI as anticipate minimal labor course remaining.   6/70, DO 8:35 PM

## 2021-02-02 NOTE — Anesthesia Preprocedure Evaluation (Signed)
Anesthesia Evaluation  Patient identified by MRN, date of birth, ID band Patient awake    Reviewed: Allergy & Precautions, NPO status , Patient's Chart, lab work & pertinent test results  Airway Mallampati: III  TM Distance: >3 FB Neck ROM: Full    Dental no notable dental hx.    Pulmonary neg pulmonary ROS,    Pulmonary exam normal breath sounds clear to auscultation       Cardiovascular negative cardio ROS Normal cardiovascular exam Rhythm:Regular Rate:Normal     Neuro/Psych negative neurological ROS  negative psych ROS   GI/Hepatic negative GI ROS, Neg liver ROS,   Endo/Other  diabetesMorbid obesity  Renal/GU negative Renal ROS  negative genitourinary   Musculoskeletal negative musculoskeletal ROS (+)   Abdominal   Peds negative pediatric ROS (+)  Hematology negative hematology ROS (+)   Anesthesia Other Findings   Reproductive/Obstetrics (+) Pregnancy                             Anesthesia Physical Anesthesia Plan  ASA: 3  Anesthesia Plan: Epidural   Post-op Pain Management:    Induction:   PONV Risk Score and Plan: 2 and Treatment may vary due to age or medical condition  Airway Management Planned: Natural Airway  Additional Equipment:   Intra-op Plan:   Post-operative Plan:   Informed Consent: I have reviewed the patients History and Physical, chart, labs and discussed the procedure including the risks, benefits and alternatives for the proposed anesthesia with the patient or authorized representative who has indicated his/her understanding and acceptance.       Plan Discussed with: Anesthesiologist  Anesthesia Plan Comments:         Anesthesia Quick Evaluation

## 2021-02-03 ENCOUNTER — Encounter (HOSPITAL_COMMUNITY): Payer: Self-pay | Admitting: Obstetrics and Gynecology

## 2021-02-03 DIAGNOSIS — O34211 Maternal care for low transverse scar from previous cesarean delivery: Secondary | ICD-10-CM

## 2021-02-03 DIAGNOSIS — Z3A37 37 weeks gestation of pregnancy: Secondary | ICD-10-CM

## 2021-02-03 DIAGNOSIS — O24425 Gestational diabetes mellitus in childbirth, controlled by oral hypoglycemic drugs: Secondary | ICD-10-CM

## 2021-02-03 LAB — GLUCOSE, CAPILLARY: Glucose-Capillary: 170 mg/dL — ABNORMAL HIGH (ref 70–99)

## 2021-02-03 LAB — GLUCOSE, RANDOM: Glucose, Bld: 131 mg/dL — ABNORMAL HIGH (ref 70–99)

## 2021-02-03 MED ORDER — SENNOSIDES-DOCUSATE SODIUM 8.6-50 MG PO TABS
2.0000 | ORAL_TABLET | Freq: Every day | ORAL | Status: DC
  Administered 2021-02-04 – 2021-02-05 (×2): 2 via ORAL
  Filled 2021-02-03 (×2): qty 2

## 2021-02-03 MED ORDER — ACETAMINOPHEN 325 MG PO TABS: 650.0000 mg | ORAL_TABLET | ORAL | Status: DC | PRN

## 2021-02-03 MED ORDER — SIMETHICONE 80 MG PO CHEW: 80.0000 mg | CHEWABLE_TABLET | ORAL | Status: DC | PRN

## 2021-02-03 MED ORDER — DIPHENHYDRAMINE HCL 25 MG PO CAPS: 25.0000 mg | ORAL_CAPSULE | Freq: Four times a day (QID) | ORAL | Status: DC | PRN

## 2021-02-03 MED ORDER — IBUPROFEN 600 MG PO TABS
600.0000 mg | ORAL_TABLET | Freq: Four times a day (QID) | ORAL | Status: DC
  Administered 2021-02-03 – 2021-02-05 (×8): 600 mg via ORAL
  Filled 2021-02-03 (×9): qty 1

## 2021-02-03 MED ORDER — MEASLES, MUMPS & RUBELLA VAC IJ SOLR: 0.5000 mL | Freq: Once | INTRAMUSCULAR | Status: DC

## 2021-02-03 MED ORDER — PRENATAL MULTIVITAMIN CH
1.0000 | ORAL_TABLET | Freq: Every day | ORAL | Status: DC
  Administered 2021-02-03 – 2021-02-05 (×3): 1 via ORAL
  Filled 2021-02-03 (×3): qty 1

## 2021-02-03 MED ORDER — BENZOCAINE-MENTHOL 20-0.5 % EX AERO: 1.0000 "application " | INHALATION_SPRAY | CUTANEOUS | Status: DC | PRN

## 2021-02-03 MED ORDER — TETANUS-DIPHTH-ACELL PERTUSSIS 5-2.5-18.5 LF-MCG/0.5 IM SUSY: 0.5000 mL | PREFILLED_SYRINGE | Freq: Once | INTRAMUSCULAR | Status: DC

## 2021-02-03 MED ORDER — WITCH HAZEL-GLYCERIN EX PADS: 1.0000 "application " | MEDICATED_PAD | CUTANEOUS | Status: DC | PRN

## 2021-02-03 MED ORDER — METHYLERGONOVINE MALEATE 0.2 MG/ML IJ SOLN
INTRAMUSCULAR | Status: AC
  Administered 2021-02-03: 0.2 mg
  Filled 2021-02-03: qty 1

## 2021-02-03 MED ORDER — COCONUT OIL OIL: 1.0000 "application " | TOPICAL_OIL | Status: DC | PRN

## 2021-02-03 MED ORDER — ONDANSETRON HCL 4 MG/2ML IJ SOLN: 4.0000 mg | INTRAMUSCULAR | Status: DC | PRN

## 2021-02-03 MED ORDER — LIDOCAINE HCL (PF) 1 % IJ SOLN
INTRAMUSCULAR | Status: DC | PRN
  Administered 2021-02-03: 10 mL via EPIDURAL

## 2021-02-03 MED ORDER — MEDROXYPROGESTERONE ACETATE 150 MG/ML IM SUSP: 150.0000 mg | INTRAMUSCULAR | Status: DC | PRN

## 2021-02-03 MED ORDER — ONDANSETRON HCL 4 MG PO TABS: 4.0000 mg | ORAL_TABLET | ORAL | Status: DC | PRN

## 2021-02-03 MED ORDER — DIBUCAINE (PERIANAL) 1 % EX OINT: 1.0000 "application " | TOPICAL_OINTMENT | CUTANEOUS | Status: DC | PRN

## 2021-02-03 NOTE — Anesthesia Procedure Notes (Addendum)
Epidural Patient location during procedure: OB Start time: 02/02/2021 11:55 PM End time: 02/03/2021 12:15 AM  Staffing Anesthesiologist: Mellody Dance, MD Performed: anesthesiologist   Preanesthetic Checklist Completed: patient identified, IV checked, site marked, risks and benefits discussed, monitors and equipment checked, pre-op evaluation and timeout performed  Epidural Patient position: sitting Prep: DuraPrep Patient monitoring: heart rate, cardiac monitor, continuous pulse ox and blood pressure Approach: midline Location: L2-L3 Injection technique: LOR saline  Needle:  Needle type: Tuohy  Needle gauge: 17 G Needle length: 9 cm Needle insertion depth: 9 cm Catheter type: closed end flexible Catheter size: 20 Guage Catheter at skin depth: 14 cm Test dose: negative and Other  Assessment Events: blood not aspirated, injection not painful, no injection resistance and negative IV test  Additional Notes Informed consent obtained prior to proceeding including risk of failure, 1% risk of PDPH, risk of minor discomfort and bruising.  Discussed rare but serious complications including epidural abscess, permanent nerve injury, epidural hematoma.  Discussed alternatives to epidural analgesia and patient desires to proceed.  Timeout performed pre-procedure verifying patient name, procedure, and platelet count. Patient dilated 8cm at time of epidural placement. Took several minutes to get patient in and to maintain proper positioning despite use of Spanish interpreter.  Overall, Patient tolerated procedure well. Tanna Furry, MD

## 2021-02-03 NOTE — Discharge Summary (Signed)
Postpartum Discharge Summary  Date of Service updated 02/05/21     Patient Name: Regina [name] DOB: 26-Jan-1986 MRN: [id number]  Date of admission: 02/02/2021 Delivery date:02/03/2021  Delivering provider: Renard Matter  Date of discharge: 02/05/2021  Admitting diagnosis: Encounter for induction of labor [Z34.90] Intrauterine pregnancy: [redacted]w[redacted]d    Secondary diagnosis:  Active Problems:   Supervision of high risk pregnancy, antepartum   Gestational diabetes mellitus (GDM) affecting fourth pregnancy   Encounter for induction of labor   Vaginal delivery  Additional problems: None    Discharge diagnosis: Term Pregnancy Delivered and GDM A2                                              Post partum procedures: none Augmentation: Pitocin and IP Foley Complications: None  Hospital course: Induction of Labor With Vaginal Delivery   35y.o. yo [id number] admitted to the hospital 02/02/2021 for induction of labor.  Indication for induction: A2 DM.  Patient had an uncomplicated labor course as follows: Membrane Rupture Time/Date: 1:08 AM ,02/03/2021   Delivery Method:VBAC, Spontaneous  Episiotomy: None  Lacerations:  None  Details of delivery can be found in separate delivery note.  Patient had a routine postpartum course. Patient is discharged home 02/05/21.  Newborn Data: Birth date:02/03/2021  Birth time:1:55 AM  Gender:Female  Living status:Living  Apgars:8 ,9  Weight:2995 g   Magnesium Sulfate received: No BMZ received: No Rhophylac:N/A MMR:N/A T-DaP:declined Flu: N/A Transfusion:No  Physical exam  Vitals:   02/03/21 1951 02/04/21 1336 02/04/21 2009 02/05/21 0630  BP:  (!) 104/57 111/70 99/68  Pulse: 86 68 67 80  Resp: 16 18 17 15   Temp: 98.2 F (36.8 C) 98.2 F (36.8 C) 98.5 F (36.9 C) 98 F (36.7 C)  TempSrc: Oral Oral Oral   SpO2: 100%  100% 98%  Weight:      Height:       General: alert, cooperative, and no distress Lochia: appropriate Uterine  Fundus: firm Incision: N/A DVT Evaluation: No evidence of DVT seen on physical exam. Labs: Lab Results  Component Value Date   WBC 7.9 02/02/2021   HGB 12.5 02/02/2021   HCT 37.8 02/02/2021   MCV 87.5 02/02/2021   PLT 218 02/02/2021   CMP Latest Ref Rng & Units 02/03/2021  Glucose 70 - 99 mg/dL 131(H)  BUN 6 - 20 mg/dL -  Creatinine 0.44 - 1.00 mg/dL -  Sodium 135 - 145 mmol/L -  Potassium 3.5 - 5.1 mmol/L -  Chloride 101 - 111 mmol/L -  CO2 22 - 32 mmol/L -  Calcium 8.9 - 10.3 mg/dL -  Total Protein 6.5 - 8.1 g/dL -  Total Bilirubin 0.3 - 1.2 mg/dL -  Alkaline Phos 38 - 126 U/L -  AST 15 - 41 U/L -  ALT 14 - 54 U/L -   Edinburgh Score: Edinburgh Postnatal Depression Scale Screening Tool 02/03/2021  I have been able to laugh and see the funny side of things. (No Data)     After visit meds:  Allergies as of 02/05/2021   No Known Allergies      Medication List     STOP taking these medications    metFORMIN 1000 MG tablet Commonly known as: Glucophage       TAKE these medications  acetaminophen 325 MG tablet Commonly known as: Tylenol Take 2 tablets (650 mg total) by mouth every 4 (four) hours as needed (for pain scale < 4).   ibuprofen 600 MG tablet Commonly known as: ADVIL Take 1 tablet (600 mg total) by mouth every 6 (six) hours.   PRENATAL VITAMIN PO Take by mouth.         Discharge home in stable condition Infant Feeding: Breast Infant Disposition:home with mother Discharge instruction: per After Visit Summary and Postpartum booklet. Activity: Advance as tolerated. Pelvic rest for 6 weeks.  Diet: routine diet Future Appointments: Future Appointments  Date Time Provider [place]  03/26/2021  8:50 AM [facility]  03/26/2021 10:15 AM Woodroe Mode, MD [facility]   Follow up Visit: Message sent to [facility] by Dr. Cy Blamer on 02/03/2021 Please schedule this patient for a In person postpartum visit in 4 weeks with the following  provider: MD. Additional Postpartum F/U:2 hour GTT  High risk pregnancy complicated by: GDM Delivery mode:  VBAC, Spontaneous  Anticipated Birth Control:   Planning Patch   02/05/2021 Fatima Blank, CNM

## 2021-02-03 NOTE — Lactation Note (Addendum)
This note was copied from a baby's chart. Lactation Consultation Note  Patient Name: Regina [name] [id number] Date: 02/03/2021 Reason for consult: Initial assessment;Mother's request;Difficult latch;Early term 37-38.6wks;Maternal endocrine disorder Age:37 hours  Mom with provider and translator, LC had to wait for them to complete the assessment  Spanish translator Marlene Yemen assisted with visit.  Infant small mouth, mother large nipples. Mom preference for cradle but infant gets better depth with cross cradle with breast compression. Mom to offer EBM via spoon if unable to get infant to latch.   Plan 1. To feed based on cues 8-12x 24 hr period.  2. Mom to offer breast STS and get depth with latch and look for signs of milk transfer. Mom will need assistance with next feeding. 3. Mom to supplement with extra slow flow nipple Similac 360 20 cal/0z . BF supplementation guide provided.  4. Manual pump post pump q 3hrs 10 min each breast.  5. I and O sheet reviewed.  6 LC spanish brochure reviewed.  All questions answered.   Infant tongue thrusting with yellow slow flow nipple and leaking of milk from sides. LC switched to purple extra slow flow and went over pace feeding with mother.  Mom to call RN if noticed infant still leaking with purple nipple.    Maternal Data Has patient been taught Hand Expression?: Yes Does the patient have breastfeeding experience prior to this delivery?: Yes How long did the patient breastfeed?: three children 1 year, 5 years and 3 years. Mom stated no problem with her milk supply  Feeding Mother's Current Feeding Choice: Breast Milk and Formula  LATCH Score Latch: Repeated attempts needed to sustain latch, nipple held in mouth throughout feeding, stimulation needed to elicit sucking reflex.  Audible Swallowing: None (Nipples are large and infant not able to get depth needed. Infant chew on nipple. I worked on Psychiatrist to Psychiatrist but  will need assistance not as comfortable to her.)  Type of Nipple: Everted at rest and after stimulation  Comfort (Breast/Nipple): Soft / non-tender  Hold (Positioning): Assistance needed to correctly position infant at breast and maintain latch.  LATCH Score: 6   Lactation Tools Discussed/Used Tools: Pump;Flanges Flange Size: 27 Breast pump type: Manual Pump Education: Setup, frequency, and cleaning;Milk Storage Reason for Pumping: increase stimulation Pumping frequency: every 3 hrs for 10 min each breast.  Interventions Interventions: Breast feeding basics reviewed;Position options;Assisted with latch;Expressed milk;Skin to skin;Breast massage;Hand express;Hand pump;Adjust position;Education;Support pillows;Pace feeding  Discharge Pump: Manual  Consult Status Consult Status: Follow-up Date: 02/04/21 Follow-up type: In-patient    Regina Christner  [name] 02/03/2021, 1:30 PM

## 2021-02-03 NOTE — Anesthesia Postprocedure Evaluation (Signed)
Anesthesia Post Note  Patient: Regina [name]  Procedure(s) Performed: AN AD HOC LABOR EPIDURAL     Patient location during evaluation: Mother Baby Anesthesia Type: Epidural Level of consciousness: awake Pain management: satisfactory to patient Vital Signs Assessment: post-procedure vital signs reviewed and stable Respiratory status: spontaneous breathing Cardiovascular status: stable Anesthetic complications: no   No notable events documented.  Last Vitals:  Vitals:   02/03/21 0529 02/03/21 0945  BP: 110/84 101/63  Pulse: 99 85  Resp: 18 19  Temp: 36.7 C 36.7 C  SpO2: 99% 99%    Last Pain:  Vitals:   02/03/21 1413  TempSrc:   PainSc: 0-No pain   Pain Goal:                   [employer]

## 2021-02-03 NOTE — Progress Notes (Signed)
Patient eating a [place] sandwich and chips

## 2021-02-04 LAB — GLUCOSE, CAPILLARY: Glucose-Capillary: 108 mg/dL — ABNORMAL HIGH (ref 70–99)

## 2021-02-04 NOTE — Progress Notes (Signed)
Post Partum Day 1 Subjective: no complaints, up ad lib, voiding, and tolerating PO  Objective: Blood pressure 121/74, pulse 86, temperature 98.2 F (36.8 C), temperature source Oral, resp. rate 16, height 5\' 2"  (1.575 m), weight 106.4 kg, last menstrual period 04/21/2020, SpO2 100 %, unknown if currently breastfeeding.  Physical Exam:  General: alert, cooperative, and no distress Lochia: appropriate Uterine Fundus: firm Incision: NA DVT Evaluation: No evidence of DVT seen on physical exam.  Recent Labs    02/02/21 0645  HGB 12.5  HCT 37.8    Assessment/Plan: Plan for discharge tomorrow, Breastfeeding/Bottle Feeding, and Contraception Depo.    LOS: 2 days   04/04/21 02/04/2021, 11:48 AM

## 2021-02-05 MED ORDER — IBUPROFEN 600 MG PO TABS: 600.0000 mg | ORAL_TABLET | Freq: Four times a day (QID) | ORAL | 0 refills | Status: DC

## 2021-02-05 MED ORDER — ACETAMINOPHEN 325 MG PO TABS: 650.0000 mg | ORAL_TABLET | ORAL | 0 refills | Status: DC | PRN

## 2021-02-05 NOTE — Lactation Note (Addendum)
This note was copied from a baby's chart. Lactation Consultation Note  Patient Name: [name] [id number] Date: 02/05/2021   Age:37 hours  LC went in with help of spanish interpreter Wallene Huh to find out if mother will continue with breastfeeding since mostly giving bottles. Mother stated she only formula feed going forward. Infant will remain baby patient working on phototherapy for his hyperbilirubinemia.   LC provided mother with feeding guide encouraging her to feed based on cues 8-12x 24hr period offering 30 -60 ml per feeding.  LC will remove mother from [facility] service.  RN aware of mothers plan to exclusively bottle feed formula only.   Maternal Data    Feeding Nipple Type: Slow - flow  LATCH Score                    Lactation Tools Discussed/Used    Interventions    Discharge    Consult Status      Regina Mellinger  [name] 02/05/2021, 6:04 PM

## 2021-02-05 NOTE — Progress Notes (Signed)
Mother had difficulty understanding questions, denies SI, HI and depression per interpreter.

## 2021-02-05 NOTE — Lactation Note (Signed)
This note was copied from a baby's chart. Lactation Consultation Note  Patient Name: Regina [name] [id number] Date: 02/05/2021 Reason for consult: Follow-up assessment;Early term 37-38.6wks;Hyperbilirubinemia Age:37 hours, -5% weight loss on billi lights. Spanish Interpreter used: Daniel # [phone] Per mom,  most feedings have been 1 hour in length.  Per mom, infant recently breastfeed for 30 minutes, LC did not observe latch and mom was supplementing infant with formula, using slow flow bottle nipple, infant consumed 20 mls while LC was in the room. Mom placed infant back under billi lights as LC was leaving the room.  Mom's plan: 1- Continue to latch infant according to feeding cues, 8 to 12+ times within 24 hours but limit feeding 30 minutes or less to reserve energy. 2- Continue to supplement infant with formula based on infant's age with breastfeeding 18-25 mls or how ever much infant wants, if infant is not latching at the breast based on age 35 to 60 mls per feeding. 3-After feedings continue to place infant back under billi lights.   Maternal Data    Feeding Mother's Current Feeding Choice: Breast Milk and Formula Nipple Type: Extra Slow Flow  LATCH Score                    Lactation Tools Discussed/Used    Interventions    Discharge    Consult Status Consult Status: Follow-up Date: 02/06/21 Follow-up type: In-patient    Danelle Earthly 02/05/2021, 12:33 AM

## 2021-02-05 NOTE — Progress Notes (Signed)
CSW acknowledged consult for "Edinburgh score 17. Mom is spanish speaking. Not sure if she is comprehending the questions fully."  CSW spoke with RN who confirmed that MOB completed spanish version of edinburgh and spoke with interpreter regarding edinburgh. RN reported that patient does not understand questions and answered 0 to question 10 when asked verbatim. RN reported no concerns regarding MOB's behaviors/emotions. RN agreed to notify CSW if MOB requests to speak with CSW.   Unable to obtain adequate edinburgh score due to MOB not comprehending questions.   Dalary Hollar, LCSW Clinical Social Worker [facility] Cell#: [phone] 

## 2021-02-17 ENCOUNTER — Telehealth (HOSPITAL_COMMUNITY): Payer: Self-pay

## 2021-02-17 NOTE — Telephone Encounter (Signed)
No answer. No voicemail option.   Regina [name] [facility] 02/17/2021,1220

## 2021-03-26 ENCOUNTER — Other Ambulatory Visit: Payer: Self-pay

## 2021-03-26 ENCOUNTER — Emergency Department (HOSPITAL_COMMUNITY)
Admission: EM | Admit: 2021-03-26 | Discharge: 2021-03-26 | Discharge status: Against Medical Advice | Payer: Medicaid Other

## 2021-03-26 ENCOUNTER — Ambulatory Visit: Payer: Self-pay | Admitting: Obstetrics & Gynecology

## 2021-03-26 DIAGNOSIS — O24419 Gestational diabetes mellitus in pregnancy, unspecified control: Secondary | ICD-10-CM

## 2021-04-08 ENCOUNTER — Other Ambulatory Visit: Payer: Self-pay

## 2021-04-08 ENCOUNTER — Encounter (HOSPITAL_COMMUNITY): Payer: Self-pay | Admitting: Emergency Medicine

## 2021-04-08 ENCOUNTER — Emergency Department (HOSPITAL_COMMUNITY)
Admission: EM | Admit: 2021-04-08 | Discharge: 2021-04-09 | Disposition: A | Discharge status: Home / Self Care | Payer: Self-pay | Attending: Emergency Medicine | Admitting: Emergency Medicine

## 2021-04-08 DIAGNOSIS — K5900 Constipation, unspecified: Secondary | ICD-10-CM | POA: Insufficient documentation

## 2021-04-08 DIAGNOSIS — N9489 Other specified conditions associated with female genital organs and menstrual cycle: Secondary | ICD-10-CM | POA: Insufficient documentation

## 2021-04-08 LAB — COMPREHENSIVE METABOLIC PANEL
ALT: 18 U/L (ref 0–44)
AST: 18 U/L (ref 15–41)
Albumin: 4 g/dL (ref 3.5–5.0)
Alkaline Phosphatase: 79 U/L (ref 38–126)
Anion gap: 9 (ref 5–15)
BUN: 11 mg/dL (ref 6–20)
CO2: 26 mmol/L (ref 22–32)
Calcium: 9.4 mg/dL (ref 8.9–10.3)
Chloride: 101 mmol/L (ref 98–111)
Creatinine, Ser: 0.75 mg/dL (ref 0.44–1.00)
GFR, Estimated: >60 mL/min (ref >60)
Glucose, Bld: 129 mg/dL — ABNORMAL HIGH (ref 70–99)
Potassium: 3.8 mmol/L (ref 3.5–5.1)
Sodium: 136 mmol/L (ref 135–145)
Total Bilirubin: 0.5 mg/dL (ref 0.3–1.2)
Total Protein: 8.2 g/dL — ABNORMAL HIGH (ref 6.5–8.1)

## 2021-04-08 LAB — URINALYSIS, ROUTINE W REFLEX MICROSCOPIC
Bilirubin Urine: NEGATIVE
Glucose, UA: NEGATIVE mg/dL
Hgb urine dipstick: NEGATIVE
Ketones, ur: NEGATIVE mg/dL
Leukocytes,Ua: NEGATIVE
Nitrite: NEGATIVE
Protein, ur: NEGATIVE mg/dL
Specific Gravity, Urine: 1.006 (ref 1.005–1.030)
pH: 6 (ref 5.0–8.0)

## 2021-04-08 LAB — CBC
HCT: 43.3 % (ref 36.0–46.0)
Hemoglobin: 14.2 g/dL (ref 12.0–15.0)
MCH: 28.3 pg (ref 26.0–34.0)
MCHC: 32.8 g/dL (ref 30.0–36.0)
MCV: 86.3 fL (ref 80.0–100.0)
Platelets: 261 10*3/uL (ref 150–400)
RBC: 5.02 MIL/uL (ref 3.87–5.11)
RDW: 13 % (ref 11.5–15.5)
WBC: 10 10*3/uL (ref 4.0–10.5)
nRBC: 0 % (ref 0.0–0.2)

## 2021-04-08 LAB — LIPASE, BLOOD: Lipase: 24 U/L (ref 11–51)

## 2021-04-08 LAB — I-STAT BETA HCG BLOOD, ED (MC, WL, AP ONLY): I-stat hCG, quantitative: <5 m[IU]/mL (ref <5)

## 2021-04-08 NOTE — ED Provider Notes (Signed)
Emergency Medicine Provider Triage Evaluation Note  Regina [name] , a 35 y.o. female  was evaluated in triage.  Pt complains of feeling of straining with urination, defecation with minimal ability to pass stool / urine since this afternoon around 3 hrs PTA. Denies having this problem in the past. No NVD. Some pain with straining. No hx of kidney stones, no blood in urine / stool. LMP October. No saddles anesthesia or known back injury.  Review of Systems  Positive: As above Negative: As above  Physical Exam  BP 107/88 (BP Location: Right Arm)   Pulse 100   Temp 98.7 F (37.1 C) (Oral)   Resp 15   LMP 03/21/2021 (Approximate)   SpO2 98%  Gen:   Awake, no distress   Resp:  Normal effort  MSK:   Moves extremities without difficulty  Other:  Some TTP suprapubic region, fairly protuberant abdomen, no uterine fundus palpated  Medical Decision Making  Medically screening exam initiated at 6:05 PM.  Appropriate orders placed.  Regina [name] was informed that the remainder of the evaluation will be completed by another provider, this initial triage assessment does not replace that evaluation, and the importance of remaining in the ED until their evaluation is complete.  Constipation, difficulty urinating   Regina [name] 04/08/21 1807    Rolan Bucco, MD 04/08/21 2034

## 2021-04-08 NOTE — ED Triage Notes (Signed)
[employer] interpreter used- Reports generalized abd pain, constipation, and dysuria since this afternoon.

## 2021-04-09 ENCOUNTER — Emergency Department (HOSPITAL_COMMUNITY): Payer: Self-pay

## 2021-04-09 MED ORDER — POLYETHYLENE GLYCOL 3350 17 G PO PACK
17.0000 g | PACK | Freq: Every day | ORAL | Status: DC
  Administered 2021-04-09: 17 g via ORAL
  Filled 2021-04-09: qty 1

## 2021-04-09 MED ORDER — BISACODYL 10 MG RE SUPP
10.0000 mg | Freq: Once | RECTAL | Status: AC
  Administered 2021-04-09: 10 mg via RECTAL
  Filled 2021-04-09: qty 1

## 2021-04-09 NOTE — ED Provider Notes (Signed)
[facility] EMERGENCY DEPARTMENT Provider Note   CSN: [id number] Arrival date & time: 04/08/21  1718     History Chief Complaint  Patient presents with   Abdominal Pain   Constipation    Regina [name] is a 35 y.o. female. Interviewed with Radiation protection practitioner. HPI She presents for evaluation of difficulty stooling and urinating since yesterday about 3 PM.  She had a prolonged ED waiting room stay, to get an open bed for examination.  She was able to eat up until stooling problem happened yesterday.  She denies having this problem previously.  She states she feels like "something is blocking it."  She denies nausea, vomiting, fever, chills, cough, shortness of breath, chest pain, weakness or dizziness.  There are no other known active modifying factors.    Past Medical History:  Diagnosis Date   Gestational Diabetes    Medical history non-contributory     Patient Active Problem List   Diagnosis Date Noted   Vaginal delivery 02/03/2021   Encounter for induction of labor 02/02/2021   Abnormal fetal ultrasound 01/17/2021   Supervision of high risk pregnancy, antepartum 10/31/2020   Gestational diabetes mellitus (GDM) affecting fourth pregnancy 10/31/2020   History of cesarean delivery 10/31/2020   History of VBAC 11/18/2013    Past Surgical History:  Procedure Laterality Date   CESAREAN SECTION       OB History     Gravida  4   Para  4   Term  4   Preterm  0   AB  0   Living  4      SAB  0   IAB  0   Ectopic  0   Multiple  0   Live Births  2           Family History  Problem Relation Age of Onset   Heart disease Mother    Hypertension Mother    Diabetes Mother    Hyperlipidemia Mother    Kidney disease Father     Social History   Tobacco Use   Smoking status: Never   Smokeless tobacco: Never  Vaping Use   Vaping Use: Never used  Substance Use Topics   Alcohol use: No   Drug use: No    Home  Medications Prior to Admission medications   Medication Sig Start Date End Date Taking? Authorizing Provider  acetaminophen (TYLENOL) 325 MG tablet Take 2 tablets (650 mg total) by mouth every 4 (four) hours as needed (for pain scale < 4). 02/05/21   Leftwich-Kirby, Wilmer Floor, CNM  ibuprofen (ADVIL) 600 MG tablet Take 1 tablet (600 mg total) by mouth every 6 (six) hours. 02/05/21   Leftwich-Kirby, Wilmer Floor, CNM  Prenatal Vit-Fe Fumarate-FA (PRENATAL VITAMIN PO) Take by mouth.    [provider]    Allergies    Patient has no known allergies.  Review of Systems   Review of Systems  All other systems reviewed and are negative.  Physical Exam Updated Vital Signs BP 114/80   Pulse 75   Temp 98.1 F (36.7 C) (Oral)   Resp 18   Ht 5\' 2"  (1.575 m)   Wt 106.4 kg   LMP 03/21/2021 (Approximate)   SpO2 99%   BMI 42.90 kg/m   Physical Exam Vitals and nursing note reviewed.  Constitutional:      General: She is not in acute distress.    Appearance: She is well-developed. She is not ill-appearing, toxic-appearing or  diaphoretic.  HENT:     Head: Normocephalic and atraumatic.     Right Ear: External ear normal.     Left Ear: External ear normal.  Eyes:     Conjunctiva/sclera: Conjunctivae normal.     Pupils: Pupils are equal, round, and reactive to light.  Neck:     Trachea: Phonation normal.  Cardiovascular:     Rate and Rhythm: Normal rate and regular rhythm.     Heart sounds: Normal heart sounds.  Pulmonary:     Effort: Pulmonary effort is normal.     Breath sounds: Normal breath sounds.  Abdominal:     General: There is no distension.     Palpations: Abdomen is soft. There is no mass.     Tenderness: There is abdominal tenderness (Left upper quadrant, mild). There is no guarding or rebound.     Hernia: No hernia is present.  Genitourinary:    Comments: Normal anus.  Small to moderate amount of somewhat firm brown stool in rectal vault.  No fecal  impaction. Musculoskeletal:        General: Normal range of motion.     Cervical back: Normal range of motion and neck supple.  Skin:    General: Skin is warm and dry.  Neurological:     Mental Status: She is alert and oriented to person, place, and time.     Cranial Nerves: No cranial nerve deficit.     Sensory: No sensory deficit.     Motor: No abnormal muscle tone.     Coordination: Coordination normal.  Psychiatric:        Mood and Affect: Mood normal.        Behavior: Behavior normal.        Thought Content: Thought content normal.        Judgment: Judgment normal.    ED Results / Procedures / Treatments   Labs (all labs ordered are listed, but only abnormal results are displayed) Labs Reviewed  COMPREHENSIVE METABOLIC PANEL - Abnormal; Notable for the following components:      Result Value   Glucose, Bld 129 (*)    Total Protein 8.2 (*)    All other components within normal limits  URINALYSIS, ROUTINE W REFLEX MICROSCOPIC - Abnormal; Notable for the following components:   Color, Urine STRAW (*)    All other components within normal limits  LIPASE, BLOOD  CBC  I-STAT BETA HCG BLOOD, ED ([facility], [facility], [facility] ONLY)    EKG None  Radiology DG Abdomen 1 View  Result Date: 04/09/2021 CLINICAL DATA:  Abdominal pain EXAM: ABDOMEN - 1 VIEW COMPARISON:  None. FINDINGS: Scattered large and small bowel gas is noted. Mild retained fecal material is noted within the rectum and right colon consistent with a degree of constipation. No free air is seen. No bony abnormality is noted. IMPRESSION: Changes of mild constipation. Electronically Signed   By: Alcide Clever M.D.   On: 04/09/2021 02:43    Procedures Procedures   Medications Ordered in ED Medications  polyethylene glycol (MIRALAX / GLYCOLAX) packet 17 g (17 g Oral Given 04/09/21 1045)  bisacodyl (DULCOLAX) suppository 10 mg (10 mg Rectal Given 04/09/21 1045)    ED Course  I have reviewed the triage vital signs and the nursing  notes.  Pertinent labs & imaging results that were available during my care of the patient were reviewed by me and considered in my medical decision making (see chart for details).    MDM Rules/Calculators/A&P  Patient Vitals for the past 24 hrs:  BP Temp Temp src Pulse Resp SpO2 Height Weight  04/09/21 1030 114/80 -- -- 75 18 99 % -- --  04/09/21 1000 107/61 -- -- 63 16 100 % -- --  04/09/21 0930 119/83 -- -- 77 18 100 % -- --  04/09/21 0918 -- -- -- -- -- -- 5\' 2"  (1.575 m) 106.4 kg  04/09/21 0900 125/87 -- -- 60 19 99 % -- --  04/09/21 0845 125/78 -- -- 78 11 100 % -- --  04/09/21 0830 121/71 -- -- 74 16 100 % -- --  04/09/21 0815 117/67 -- -- 73 19 100 % -- --  04/09/21 0800 129/81 -- -- 72 10 100 % -- --  04/09/21 0608 119/68 98.1 F (36.7 C) Oral 69 13 100 % -- --  04/09/21 0413 120/82 98.2 F (36.8 C) -- 76 14 100 % -- --  04/09/21 0013 (!) 127/98 97.7 F (36.5 C) -- 99 14 99 % -- --  04/08/21 2230 (!) 130/95 -- -- 82 17 98 % -- --  04/08/21 1943 (!) 125/108 97.7 F (36.5 C) Oral 79 14 100 % -- --  04/08/21 1750 107/88 98.7 F (37.1 C) Oral 100 15 98 % -- --    12:06 PM Reevaluation with update and discussion. After initial assessment and treatment, an updated evaluation reveals she has been able to pass some stool after Duphalac suppository.13/06/22   Medical Decision Making:  This patient is presenting for evaluation of Decreased stooling and voiding, which does require a range of treatment options, and is a complaint that involves a moderate risk of morbidity and mortality. The differential diagnoses includeConstipation, intestinal obstruction, urinary bladder mass. I decided to review old records, and in summary Middle-aged female, presenting with unprovoked constipation.  I Did not require additional historical information from anyone.  Clinical Laboratory Tests Ordered, included CBC and Metabolic panel, pregnancy test, lipase,  urinalysis. Review indicates Normal. Radiologic Tests Ordered, included 1 view abdomen.  I independently Visualized: Radiographs Images, which show Mild constipation     Critical Interventions- Clinical Evaluation, laboratory testing, radiography, observation and reassessment  After These Interventions, the Patient was reevaluated and was found improved and stable for discharge.  Nonspecific constipation, which can be treated symptomatically.  Doubt bowel obstruction, urinary outflow obstruction, metabolic disorder or serious infection. CRITICAL CARE-no Performed by: Mancel Bale  Nursing Notes Reviewed/ Care Coordinated Applicable Imaging Reviewed Interpretation of Laboratory Data incorporated into ED treatment  The patient appears reasonably screened and/or stabilized for discharge and I doubt any other medical condition or other [facility] requiring further screening, evaluation, or treatment in the ED at this time prior to discharge.  Plan: Home Medications-OTC stool softener; Home Treatments-rest, fluids, fiber; return here if the recommended treatment, does not improve the symptoms; Recommended follow up-PCP, PR     Final Clinical Impression(s) / ED Diagnoses Final diagnoses:  Constipation, unspecified constipation type    Rx / DC Orders ED Discharge Orders     None        [facility], MD 04/09/21 1207

## 2021-04-09 NOTE — Discharge Instructions (Signed)
Use MiraLAX, 17 g in 8 ounces of juice, twice a day for 1 month.  Use Dulcolax suppository, 10 mg, twice a day for 3 days.  Try to increase fiber in your diet.  Drink 2 L of water every day.

## 2021-04-09 NOTE — ED Notes (Signed)
Reviewed discharge instructions with patient. Follow-up care and medications reviewed. Patient  verbalized understanding. Patient A&Ox4, VSS, and ambulatory with steady gait upon discharge.  °

## 2022-09-25 ENCOUNTER — Emergency Department (HOSPITAL_COMMUNITY): Payer: Self-pay

## 2022-09-25 ENCOUNTER — Encounter (HOSPITAL_COMMUNITY): Payer: Self-pay | Admitting: Emergency Medicine

## 2022-09-25 ENCOUNTER — Emergency Department (HOSPITAL_COMMUNITY)
Admission: EM | Admit: 2022-09-25 | Discharge: 2022-09-26 | Disposition: A | Discharge status: Home / Self Care | Payer: Self-pay | Attending: Emergency Medicine | Admitting: Emergency Medicine

## 2022-09-25 ENCOUNTER — Other Ambulatory Visit: Payer: Self-pay

## 2022-09-25 DIAGNOSIS — L02215 Cutaneous abscess of perineum: Secondary | ICD-10-CM | POA: Insufficient documentation

## 2022-09-25 DIAGNOSIS — N939 Abnormal uterine and vaginal bleeding, unspecified: Secondary | ICD-10-CM | POA: Insufficient documentation

## 2022-09-25 DIAGNOSIS — R000 Tachycardia, unspecified: Secondary | ICD-10-CM | POA: Insufficient documentation

## 2022-09-25 LAB — CBC WITH DIFFERENTIAL/PLATELET
Abs Immature Granulocytes: 0.02 10*3/uL (ref 0.00–0.07)
Basophils Absolute: 0 10*3/uL (ref 0.0–0.1)
Basophils Relative: 0 %
Eosinophils Absolute: 0 10*3/uL (ref 0.0–0.5)
Eosinophils Relative: 0 %
HCT: 39.2 % (ref 36.0–46.0)
Hemoglobin: 12.7 g/dL (ref 12.0–15.0)
Immature Granulocytes: 0 %
Lymphocytes Relative: 13 %
Lymphs Abs: 1.3 10*3/uL (ref 0.7–4.0)
MCH: 27.5 pg (ref 26.0–34.0)
MCHC: 32.4 g/dL (ref 30.0–36.0)
MCV: 85 fL (ref 80.0–100.0)
Monocytes Absolute: 0.6 10*3/uL (ref 0.1–1.0)
Monocytes Relative: 6 %
Neutro Abs: 7.9 10*3/uL — ABNORMAL HIGH (ref 1.7–7.7)
Neutrophils Relative %: 81 %
Platelets: 254 10*3/uL (ref 150–400)
RBC: 4.61 MIL/uL (ref 3.87–5.11)
RDW: 12.7 % (ref 11.5–15.5)
WBC: 9.8 10*3/uL (ref 4.0–10.5)
nRBC: 0 % (ref 0.0–0.2)

## 2022-09-25 LAB — I-STAT BETA HCG BLOOD, ED (MC, WL, AP ONLY): I-stat hCG, quantitative: <5 m[IU]/mL (ref <5)

## 2022-09-25 LAB — BASIC METABOLIC PANEL
Anion gap: 10 (ref 5–15)
BUN: 8 mg/dL (ref 6–20)
CO2: 24 mmol/L (ref 22–32)
Calcium: 8.4 mg/dL — ABNORMAL LOW (ref 8.9–10.3)
Chloride: 99 mmol/L (ref 98–111)
Creatinine, Ser: 0.81 mg/dL (ref 0.44–1.00)
GFR, Estimated: >60 mL/min (ref >60)
Glucose, Bld: 181 mg/dL — ABNORMAL HIGH (ref 70–99)
Potassium: 3.5 mmol/L (ref 3.5–5.1)
Sodium: 133 mmol/L — ABNORMAL LOW (ref 135–145)

## 2022-09-25 MED ORDER — ONDANSETRON HCL 4 MG/2ML IJ SOLN
4.0000 mg | Freq: Once | INTRAMUSCULAR | Status: AC
  Administered 2022-09-25: 4 mg via INTRAVENOUS
  Filled 2022-09-25: qty 2

## 2022-09-25 MED ORDER — HYDROMORPHONE HCL 1 MG/ML IJ SOLN
1.0000 mg | Freq: Once | INTRAMUSCULAR | Status: AC
  Administered 2022-09-25: 1 mg via INTRAVENOUS
  Filled 2022-09-25: qty 1

## 2022-09-25 MED ORDER — LIDOCAINE-EPINEPHRINE (PF) 2 %-1:200000 IJ SOLN
10.0000 mL | Freq: Once | INTRAMUSCULAR | Status: AC
  Administered 2022-09-26: 10 mL
  Filled 2022-09-25: qty 20

## 2022-09-25 MED ORDER — ACETAMINOPHEN 500 MG PO TABS
1000.0000 mg | ORAL_TABLET | Freq: Once | ORAL | Status: AC
  Administered 2022-09-25: 1000 mg via ORAL
  Filled 2022-09-25: qty 2

## 2022-09-25 MED ORDER — LACTATED RINGERS IV BOLUS
1000.0000 mL | Freq: Once | INTRAVENOUS | Status: AC
  Administered 2022-09-25: 1000 mL via INTRAVENOUS

## 2022-09-25 MED ORDER — MORPHINE SULFATE (PF) 4 MG/ML IV SOLN
4.0000 mg | Freq: Once | INTRAVENOUS | Status: AC
  Administered 2022-09-25: 4 mg via INTRAVENOUS
  Filled 2022-09-25: qty 1

## 2022-09-25 MED ORDER — IOHEXOL 350 MG/ML SOLN
100.0000 mL | Freq: Once | INTRAVENOUS | Status: AC | PRN
  Administered 2022-09-25: 100 mL via INTRAVENOUS

## 2022-09-25 NOTE — ED Provider Triage Note (Signed)
Emergency Medicine Provider Triage Evaluation Note  Regina [name] , a 37 y.o. female  was evaluated in triage.  Pt complains of swelling to left posterior proximal thigh.  Started 3 days ago.  Reports chills.  Also endorses generalized bodyaches.  No additional complaints.  Denies prior history of similar swelling in this area.  Denies drainage.  Review of Systems  Positive: As above Negative: As above  Physical Exam  BP 119/75   Pulse (!) 134   Temp 98.5 F (36.9 C) (Oral)   Resp 16   LMP 08/29/2022   SpO2 98%  Gen:   Awake, no distress   Resp:  Normal effort  MSK:   Moves extremities without difficulty  Other:    Medical Decision Making  Medically screening exam initiated at 8:23 PM.  Appropriate orders placed.  Regina [name] was informed that the remainder of the evaluation will be completed by another provider, this initial triage assessment does not replace that evaluation, and the importance of remaining in the ED until their evaluation is complete.     Regina [place], PA-C 09/25/22 2023

## 2022-09-25 NOTE — ED Provider Notes (Signed)
[place] EMERGENCY DEPARTMENT AT [facility] Provider Note   CSN: [id number] Arrival date & time: 09/25/22  2008     History {Add pertinent medical, surgical, social history, OB history to HPI:1} Chief Complaint  Patient presents with   Groin Swelling    Regina [name] is a 37 y.o. female.  Patient is a 37 year old female with no significant past medical history who is presenting today with a 1 week history of worsening pain in her left leg and labial area that is gradually worsened.  It is very tender when she tries to walk and has been getting bigger in size.  In the last few days she started to feel feverish with headache, body pains/myalgias and malaise.  She denies this ever happening in the past.  Last menses was 3 weeks ago and started noticing some bleeding today.  No difficulty having bowel movements or urinating.  She has not had any nausea or vomiting.  The history is provided by the patient. The history is limited by a language barrier. A language interpreter was used (spanish).       Home Medications Prior to Admission medications   Medication Sig Start Date End Date Taking? Authorizing Provider  acetaminophen (TYLENOL) 325 MG tablet Take 2 tablets (650 mg total) by mouth every 4 (four) hours as needed (for pain scale < 4). 02/05/21   Leftwich-Kirby, Wilmer Floor, CNM  ibuprofen (ADVIL) 600 MG tablet Take 1 tablet (600 mg total) by mouth every 6 (six) hours. 02/05/21   Leftwich-Kirby, Wilmer Floor, CNM  Prenatal Vit-Fe Fumarate-FA (PRENATAL VITAMIN PO) Take by mouth.    [provider]      Allergies    Patient has no known allergies.    Review of Systems   Review of Systems  Physical Exam Updated Vital Signs BP 114/72 (BP Location: Right Arm)   Pulse (!) 115   Temp 99.7 F (37.6 C) (Oral)   Resp 20   Ht  (1.575 m)   Wt 106.6 kg   LMP 08/29/2022   SpO2 98%   BMI 42.98 kg/m  Physical Exam Vitals and nursing note reviewed. Exam  conducted with a chaperone present.  Constitutional:      General: She is not in acute distress.    Appearance: She is well-developed.  HENT:     Head: Normocephalic and atraumatic.  Eyes:     Pupils: Pupils are equal, round, and reactive to light.  Cardiovascular:     Rate and Rhythm: Regular rhythm. Tachycardia present.     Heart sounds: Normal heart sounds. No murmur heard.    No friction rub.  Pulmonary:     Effort: Pulmonary effort is normal.     Breath sounds: Normal breath sounds. No wheezing or rales.  Abdominal:     General: Bowel sounds are normal. There is no distension.     Palpations: Abdomen is soft.     Tenderness: There is no abdominal tenderness. There is no guarding or rebound.  Genitourinary:    Vagina: Bleeding present.       Comments: No labial involvement. Musculoskeletal:        General: No tenderness. Normal range of motion.     Comments: No edema  Skin:    General: Skin is warm and dry.     Findings: No rash.  Neurological:     Mental Status: She is alert and oriented to person, place, and time. Mental status is at baseline.  Cranial Nerves: No cranial nerve deficit.  Psychiatric:        Behavior: Behavior normal.     ED Results / Procedures / Treatments   Labs (all labs ordered are listed, but only abnormal results are displayed) Labs Reviewed  CBC WITH DIFFERENTIAL/PLATELET - Abnormal; Notable for the following components:      Result Value   Neutro Abs 7.9 (*)    All other components within normal limits  BASIC METABOLIC PANEL - Abnormal; Notable for the following components:   Sodium 133 (*)    Glucose, Bld 181 (*)    Calcium 8.4 (*)    All other components within normal limits  I-STAT BETA HCG BLOOD, ED ([facility], [facility], [facility] ONLY)    EKG None  Radiology No results found.  Procedures Procedures  {Document cardiac monitor, telemetry assessment procedure when appropriate:1}  Medications Ordered in ED Medications  lactated ringers  bolus 1,000 mL (has no administration in time range)  morphine (PF) 4 MG/ML injection 4 mg (has no administration in time range)  ondansetron (ZOFRAN) injection 4 mg (has no administration in time range)    ED Course/ Medical Decision Making/ A&P   {   Click here for ABCD2, HEART and other calculatorsREFRESH Note before signing :1}                          Medical Decision Making Amount and/or Complexity of Data Reviewed Radiology: ordered.  Risk Prescription drug management.   Pt  presenting today with a complaint that caries a high risk for morbidity and mortality.  Here today with findings concerning for abscess in the pelvic area.  Does not appear to involve the labia however unclear if it involves the rectum and patient does have a technically difficult exam due to body habitus.  She is having some mild vaginal bleeding today but does not appear to be connected with her complaints.  Temperature is 99.7 and patient is mildly tachycardic at 115.  Concern for abscess and cellulitis.  Will do a CT scan to get a better idea at the size of the abscess or involvement of other deeper structures.  Patient given pain control and fluids.  I independently interpreted patient's labs and CBC is within normal limits, hCG is negative and BMP with a nonfasting blood sugar of 181 normal potassium and creatinine.  CT is pending.   {Document critical care time when appropriate:1} {Document review of labs and clinical decision tools ie heart score, Chads2Vasc2 etc:1}  {Document your independent review of radiology images, and any outside records:1} {Document your discussion with family members, caretakers, and with consultants:1} {Document social determinants of health affecting pt's care:1} {Document your decision making why or why not admission, treatments were needed:1} Final Clinical Impression(s) / ED Diagnoses Final diagnoses:  None    Rx / DC Orders ED Discharge Orders     None

## 2022-09-25 NOTE — ED Triage Notes (Addendum)
Pt has swelling to left leg/labial fold/buttocks area. Now feels all over pain and thinks she is getting fever. Non draining and feels firm .

## 2022-09-26 MED ORDER — DOXYCYCLINE HYCLATE 100 MG PO CAPS: 100.0000 mg | ORAL_CAPSULE | Freq: Two times a day (BID) | ORAL | 0 refills | Status: DC

## 2022-09-26 MED ORDER — OXYCODONE-ACETAMINOPHEN 5-325 MG PO TABS: 1.0000 | ORAL_TABLET | Freq: Four times a day (QID) | ORAL | 0 refills | Status: DC | PRN

## 2022-09-26 MED ORDER — DOXYCYCLINE HYCLATE 100 MG PO TABS
100.0000 mg | ORAL_TABLET | Freq: Once | ORAL | Status: AC
  Administered 2022-09-26: 100 mg via ORAL
  Filled 2022-09-26: qty 1

## 2022-09-26 MED ORDER — HYDROMORPHONE HCL 1 MG/ML IJ SOLN
0.5000 mg | Freq: Once | INTRAMUSCULAR | Status: AC
  Administered 2022-09-26: 0.5 mg via INTRAVENOUS
  Filled 2022-09-26: qty 1

## 2022-09-26 NOTE — ED Provider Notes (Incomplete)
EMERGENCY DEPARTMENT AT [facility] Provider Note   CSN: [id number] Arrival date & time: 09/25/22  2008     History {Add pertinent medical, surgical, social history, OB history to HPI:1} Chief Complaint  Patient presents with  . Groin Swelling    Regina [name] is a 37 y.o. female.  Patient is a 37 year old female with no significant past medical history who is presenting today with a 1 week history of worsening pain in her left leg and labial area that is gradually worsened.  It is very tender when she tries to walk and has been getting bigger in size.  In the last few days she started to feel feverish with headache, body pains/myalgias and malaise.  She denies this ever happening in the past.  Last menses was 3 weeks ago and started noticing some bleeding today.  No difficulty having bowel movements or urinating.  She has not had any nausea or vomiting.  The history is provided by the patient. The history is limited by a language barrier. A language interpreter was used (spanish).       Home Medications Prior to Admission medications   Medication Sig Start Date End Date Taking? Authorizing Provider  acetaminophen (TYLENOL) 325 MG tablet Take 2 tablets (650 mg total) by mouth every 4 (four) hours as needed (for pain scale < 4). 02/05/21   Leftwich-Kirby, Wilmer Floor, CNM  ibuprofen (ADVIL) 600 MG tablet Take 1 tablet (600 mg total) by mouth every 6 (six) hours. 02/05/21   Leftwich-Kirby, Wilmer Floor, CNM  Prenatal Vit-Fe Fumarate-FA (PRENATAL VITAMIN PO) Take by mouth.    [provider]      Allergies    Patient has no known allergies.    Review of Systems   Review of Systems  Physical Exam Updated Vital Signs BP 114/72 (BP Location: Right Arm)   Pulse (!) 115   Temp 99.7 F (37.6 C) (Oral)   Resp 20   Ht 5\' 2"  (1.575 m)   Wt 106.6 kg   LMP 08/29/2022   SpO2 98%   BMI 42.98 kg/m  Physical Exam Vitals and nursing note reviewed. Exam  conducted with a chaperone present.  Constitutional:      General: She is not in acute distress.    Appearance: She is well-developed.  HENT:     Head: Normocephalic and atraumatic.  Eyes:     Pupils: Pupils are equal, round, and reactive to light.  Cardiovascular:     Rate and Rhythm: Regular rhythm. Tachycardia present.     Heart sounds: Normal heart sounds. No murmur heard.    No friction rub.  Pulmonary:     Effort: Pulmonary effort is normal.     Breath sounds: Normal breath sounds. No wheezing or rales.  Abdominal:     General: Bowel sounds are normal. There is no distension.     Palpations: Abdomen is soft.     Tenderness: There is no abdominal tenderness. There is no guarding or rebound.  Genitourinary:    Vagina: Bleeding present.       Comments: No labial involvement. Musculoskeletal:        General: No tenderness. Normal range of motion.     Comments: No edema  Skin:    General: Skin is warm and dry.     Findings: No rash.  Neurological:     Mental Status: She is alert and oriented to person, place, and time. Mental status is at baseline.  Cranial Nerves: No cranial nerve deficit.  Psychiatric:        Behavior: Behavior normal.     ED Results / Procedures / Treatments   Labs (all labs ordered are listed, but only abnormal results are displayed) Labs Reviewed  CBC WITH DIFFERENTIAL/PLATELET - Abnormal; Notable for the following components:      Result Value   Neutro Abs 7.9 (*)    All other components within normal limits  BASIC METABOLIC PANEL - Abnormal; Notable for the following components:   Sodium 133 (*)    Glucose, Bld 181 (*)    Calcium 8.4 (*)    All other components within normal limits  I-STAT BETA HCG BLOOD, ED ([facility], [facility], [facility] ONLY)    EKG EKG Interpretation  Date/Time:  Wednesday September 25 2022 21:21:59 EDT Ventricular Rate:  116 PR Interval:  165 QRS Duration: 71 QT Interval:  313 QTC Calculation: 435 R Axis:   83 Text  Interpretation: Sinus tachycardia No previous tracing Confirmed by Gwyneth Sprout ([id number]) on 09/25/2022 10:46:18 PM  Radiology CT PELVIS W CONTRAST  Result Date: 09/25/2022 CLINICAL DATA:  Soft tissue infection EXAM: CT PELVIS WITH CONTRAST TECHNIQUE: Multidetector CT imaging of the pelvis was performed using the standard protocol following the bolus administration of intravenous contrast. RADIATION DOSE REDUCTION: This exam was performed according to the departmental dose-optimization program which includes automated exposure control, adjustment of the mA and/or kV according to patient size and/or use of iterative reconstruction technique. CONTRAST:  OMNIPAQUE IOHEXOL 350 MG/ML SOLN COMPARISON:  None Available. FINDINGS: Urinary Tract:  No abnormality visualized. Bowel: The visualized large and small bowel are unremarkable. Appendix normal. No free intraperitoneal fluid. Vascular/Lymphatic: No pathologically enlarged lymph nodes. No significant vascular abnormality seen. Reproductive: There is soft tissue thickening of the vaginal introitus and anus with a rim enhancing inflammatory collection extending into the subcutaneous soft tissues of the medial left thigh in keeping with a subcutaneous abscess. This is not fully included on this examination, but measures at least 3.2 x 4.9 by at least 5.1 cm in craniocaudal dimension on this exam. Extensive surrounding inflammatory changes are noted within the subcutaneous fat. These inflammatory changes, however, remain inferior to the levator ani musculature. Other:  No abdominal wall hernia. Musculoskeletal: No acute bone abnormality. IMPRESSION: 1. Soft tissue thickening of the vaginal introitus and anus with a rim enhancing inflammatory collection extending into the subcutaneous soft tissues of the medial left thigh in keeping with a subcutaneous abscess. This is not fully included on this examination, but measures at least 3.2 x 4.9 by at least 5.1 cm in  craniocaudal dimension on this exam. Inflammatory changes remain inferior to the levator ani musculature. Electronically Signed   By: Helyn Numbers M.D.   On: 09/25/2022 23:28    Procedures Procedures  {Document cardiac monitor, telemetry assessment procedure when appropriate:1}  Medications Ordered in ED Medications  lidocaine-EPINEPHrine (XYLOCAINE W/EPI) 2 %-1:200000 (PF) injection 10 mL (has no administration in time range)  HYDROmorphone (DILAUDID) injection 0.5 mg (has no administration in time range)  doxycycline (VIBRA-TABS) tablet 100 mg (has no administration in time range)  lactated ringers bolus 1,000 mL (0 mLs Intravenous Stopped 09/25/22 2334)  morphine (PF) 4 MG/ML injection 4 mg (4 mg Intravenous Given 09/25/22 2205)  ondansetron (ZOFRAN) injection 4 mg (4 mg Intravenous Given 09/25/22 2204)  acetaminophen (TYLENOL) tablet 1,000 mg (1,000 mg Oral Given 09/25/22 2203)  HYDROmorphone (DILAUDID) injection 1 mg (1 mg Intravenous Given 09/25/22 2312)  iohexol (OMNIPAQUE) 350 MG/ML injection 100 mL (100 mLs Intravenous Contrast Given 09/25/22 2310)    ED Course/ Medical Decision Making/ A&P   {   Click here for ABCD2, HEART and other calculatorsREFRESH Note before signing :1}                          Medical Decision Making Amount and/or Complexity of Data Reviewed Labs: ordered. Decision-making details documented in ED Course. Radiology: ordered and independent interpretation performed. Decision-making details documented in ED Course.  Risk OTC drugs. Prescription drug management.   Pt  presenting today with a complaint that caries a high risk for morbidity and mortality.  Here today with findings concerning for abscess in the pelvic area.  Does not appear to involve the labia however unclear if it involves the rectum and patient does have a technically difficult exam due to body habitus.  She is having some mild vaginal bleeding today but does not appear to be connected with  her complaints.  Temperature is 99.7 and patient is mildly tachycardic at 115.  Concern for abscess and cellulitis.  Will do a CT scan to get a better idea at the size of the abscess or involvement of other deeper structures.  Patient given pain control and fluids.  I independently interpreted patient's labs and CBC is within normal limits, hCG is negative and BMP with a nonfasting blood sugar of 181 normal potassium and creatinine.  CT is pending.  12:01 AM CT showed soft tissue thickening but a formed abscess in the subcutaneous tissue.  No involvement of the rectum or vaginal structures.  Radiology reports measures at least 3.2 x 4.9 x 5.1.  This was discussed with the patient.  I&D as above with at least 30 mL of drainage and packing was placed.  Patient was started on doxycycline and given pain medication.  She will return to the emergency room or Friday or Saturday if she cannot get into see general surgery for follow-up.  She and her son are comfortable with this plan.  All their questions were answered.  Does not appear to need admission today.   {Document critical care time when appropriate:1} {Document review of labs and clinical decision tools ie heart score, Chads2Vasc2 etc:1}  {Document your independent review of radiology images, and any outside records:1} {Document your discussion with family members, caretakers, and with consultants:1} {Document social determinants of health affecting pt's care:1} {Document your decision making why or why not admission, treatments were needed:1} Final Clinical Impression(s) / ED Diagnoses Final diagnoses:  Cutaneous abscess of perineum    Rx / DC Orders ED Discharge Orders     None

## 2022-09-26 NOTE — Discharge Instructions (Addendum)
It is okay to shower and it is okay to put warm compresses over the area.  It will drain for most likely the next week.  The packing may be pulled out in phases or all at 1 time based on the person who is checking it.  You can use the pain medication as needed but if ibuprofen is taking care of the pain and it is improving you do not have to take the pain medication.  You need to start taking the antibiotic tomorrow morning because you had a dose here in the emergency room.

## 2023-06-11 ENCOUNTER — Other Ambulatory Visit: Payer: Self-pay

## 2023-06-11 ENCOUNTER — Emergency Department (HOSPITAL_COMMUNITY)
Admission: EM | Admit: 2023-06-11 | Discharge: 2023-06-11 | Disposition: A | Discharge status: Home / Self Care | Payer: Self-pay | Attending: Emergency Medicine | Admitting: Emergency Medicine

## 2023-06-11 ENCOUNTER — Emergency Department (HOSPITAL_COMMUNITY): Payer: Self-pay

## 2023-06-11 DIAGNOSIS — K8020 Calculus of gallbladder without cholecystitis without obstruction: Secondary | ICD-10-CM | POA: Insufficient documentation

## 2023-06-11 DIAGNOSIS — R739 Hyperglycemia, unspecified: Secondary | ICD-10-CM | POA: Insufficient documentation

## 2023-06-11 LAB — URINALYSIS, ROUTINE W REFLEX MICROSCOPIC
Bilirubin Urine: NEGATIVE
Glucose, UA: NEGATIVE mg/dL
Ketones, ur: NEGATIVE mg/dL
Nitrite: NEGATIVE
Protein, ur: 30 mg/dL — AB
Specific Gravity, Urine: 1.031 — ABNORMAL HIGH (ref 1.005–1.030)
pH: 5 (ref 5.0–8.0)

## 2023-06-11 LAB — COMPREHENSIVE METABOLIC PANEL
ALT: 14 U/L (ref 0–44)
AST: 17 U/L (ref 15–41)
Albumin: 3.4 g/dL — ABNORMAL LOW (ref 3.5–5.0)
Alkaline Phosphatase: 70 U/L (ref 38–126)
Anion gap: 12 (ref 5–15)
BUN: 21 mg/dL — ABNORMAL HIGH (ref 6–20)
CO2: 24 mmol/L (ref 22–32)
Calcium: 8.7 mg/dL — ABNORMAL LOW (ref 8.9–10.3)
Chloride: 101 mmol/L (ref 98–111)
Creatinine, Ser: 0.73 mg/dL (ref 0.44–1.00)
GFR, Estimated: >60 mL/min (ref >60)
Glucose, Bld: 121 mg/dL — ABNORMAL HIGH (ref 70–99)
Potassium: 3.6 mmol/L (ref 3.5–5.1)
Sodium: 137 mmol/L (ref 135–145)
Total Bilirubin: 0.3 mg/dL (ref 0.0–1.2)
Total Protein: 7 g/dL (ref 6.5–8.1)

## 2023-06-11 LAB — CBC
HCT: 39.5 % (ref 36.0–46.0)
Hemoglobin: 13.1 g/dL (ref 12.0–15.0)
MCH: 28.3 pg (ref 26.0–34.0)
MCHC: 33.2 g/dL (ref 30.0–36.0)
MCV: 85.3 fL (ref 80.0–100.0)
Platelets: 261 10*3/uL (ref 150–400)
RBC: 4.63 MIL/uL (ref 3.87–5.11)
RDW: 12.6 % (ref 11.5–15.5)
WBC: 7.7 10*3/uL (ref 4.0–10.5)
nRBC: 0 % (ref 0.0–0.2)

## 2023-06-11 LAB — HCG, SERUM, QUALITATIVE: Preg, Serum: NEGATIVE

## 2023-06-11 LAB — LIPASE, BLOOD: Lipase: 25 U/L (ref 11–51)

## 2023-06-11 MED ORDER — ONDANSETRON 4 MG PO TBDP
8.0000 mg | ORAL_TABLET | Freq: Once | ORAL | Status: AC
  Administered 2023-06-11: 8 mg via ORAL
  Filled 2023-06-11: qty 2

## 2023-06-11 MED ORDER — ONDANSETRON 8 MG PO TBDP: 8.0000 mg | ORAL_TABLET | Freq: Three times a day (TID) | ORAL | 0 refills | Status: DC | PRN

## 2023-06-11 MED ORDER — OXYCODONE-ACETAMINOPHEN 5-325 MG PO TABS
2.0000 | ORAL_TABLET | Freq: Once | ORAL | Status: AC
  Administered 2023-06-11: 2 via ORAL
  Filled 2023-06-11: qty 2

## 2023-06-11 MED ORDER — OXYCODONE-ACETAMINOPHEN 5-325 MG PO TABS: 1.0000 | ORAL_TABLET | Freq: Four times a day (QID) | ORAL | 0 refills | Status: DC | PRN

## 2023-06-11 MED ORDER — MORPHINE SULFATE (PF) 2 MG/ML IV SOLN
2.0000 mg | Freq: Once | INTRAVENOUS | Status: DC
  Filled 2023-06-11: qty 1

## 2023-06-11 MED ORDER — ONDANSETRON HCL 4 MG/2ML IJ SOLN
4.0000 mg | Freq: Once | INTRAMUSCULAR | Status: DC
  Filled 2023-06-11: qty 2

## 2023-06-11 NOTE — ED Notes (Signed)
Patient refuse vitals.

## 2023-06-11 NOTE — ED Provider Notes (Signed)
 [place] EMERGENCY DEPARTMENT AT [facility] Provider Note   CSN: [id number] Arrival date & time: 06/11/23  9491     History  Chief Complaint  Patient presents with   Abdominal Pain    Regina [name] is a 38 y.o. female.  HPI History limited by language barrier history obtained through Spanish interpreter 38 year old female presents today complaining of epigastric to right upper quadrant abdominal pain.  States it began last night around 11 PM after eating a chicken sandwich although she is not clear that it was connected to eating the wedge.  She states that she has pain 9 out of 10 it has been present since that time.  She denies nausea but states she had 1 episode of vomiting this morning.  She has had something to drink today stating that she took some type of a soda while here in the emergency department.  She denies fever, chills, prior similar symptoms.  She states her menstrual cycles are normal and she does not think that she is pregnant.  She denies any significant past medical history.  She did not take any medications or do anything that she thought made the symptoms worse or better.    Home Medications Prior to Admission medications   Medication Sig Start Date End Date Taking? Authorizing Provider  ondansetron  (ZOFRAN -ODT) 8 MG disintegrating tablet Take 1 tablet (8 mg total) by mouth every 8 (eight) hours as needed for nausea or vomiting. 06/11/23  Yes Levander Houston, MD  oxyCODONE -acetaminophen  (PERCOCET/ROXICET) 5-325 MG tablet Take 1 tablet by mouth every 6 (six) hours as needed for severe pain (pain score 7-10). 06/11/23  Yes Levander Houston, MD  acetaminophen  (TYLENOL ) 325 MG tablet Take 2 tablets (650 mg total) by mouth every 4 (four) hours as needed (for pain scale < 4). 02/05/21   Leftwich-Kirby, Olam LABOR, CNM  doxycycline  (VIBRAMYCIN ) 100 MG capsule Take 1 capsule (100 mg total) by mouth 2 (two) times daily. 09/26/22   Doretha Folks, MD  ibuprofen   (ADVIL ) 600 MG tablet Take 1 tablet (600 mg total) by mouth every 6 (six) hours. 02/05/21   Leftwich-Kirby, Olam LABOR, CNM  Prenatal Vit-Fe Fumarate-FA (PRENATAL VITAMIN PO) Take by mouth.    [provider]      Allergies    Patient has no known allergies.    Review of Systems   Review of Systems  Physical Exam Updated Vital Signs BP 109/75   Pulse 64   Temp 98.1 F (36.7 C) (Oral)   Resp 18   SpO2 100%  Physical Exam Vitals reviewed.  Constitutional:      Appearance: She is obese.  HENT:     Head: Normocephalic.     Mouth/Throat:     Mouth: Mucous membranes are moist.  Eyes:     Extraocular Movements: Extraocular movements intact.  Cardiovascular:     Rate and Rhythm: Normal rate and regular rhythm.  Pulmonary:     Effort: Pulmonary effort is normal.     Breath sounds: Normal breath sounds.  Abdominal:     General: Abdomen is flat. Bowel sounds are normal.     Palpations: Abdomen is soft.     Tenderness: There is abdominal tenderness in the right upper quadrant and epigastric area.  Skin:    General: Skin is warm and dry.     Capillary Refill: Capillary refill takes less than 2 seconds.  Neurological:     Mental Status: She is alert.  Psychiatric:  Mood and Affect: Mood normal.     ED Results / Procedures / Treatments   Labs (all labs ordered are listed, but only abnormal results are displayed) Labs Reviewed  COMPREHENSIVE METABOLIC PANEL - Abnormal; Notable for the following components:      Result Value   Glucose, Bld 121 (*)    BUN 21 (*)    Calcium 8.7 (*)    Albumin 3.4 (*)    All other components within normal limits  URINALYSIS, ROUTINE W REFLEX MICROSCOPIC - Abnormal; Notable for the following components:   APPearance HAZY (*)    Specific Gravity, Urine 1.031 (*)    Hgb urine dipstick MODERATE (*)    Protein, ur 30 (*)    Leukocytes,Ua MODERATE (*)    Bacteria, UA RARE (*)    All other components within normal limits  LIPASE, BLOOD   CBC  HCG, SERUM, QUALITATIVE    EKG None  Radiology US  Abdomen Limited RUQ (LIVER/GB) Result Date: 06/11/2023 CLINICAL DATA:  Right upper quadrant pain EXAM: ULTRASOUND ABDOMEN LIMITED RIGHT UPPER QUADRANT COMPARISON:  CT pelvis 09/25/2022 FINDINGS: Gallbladder: Stones and sludge in the gallbladder lumen. No gallbladder wall thickening or pericholecystic fluid. Negative sonographic Murphy's sign. Common bile duct: Diameter: 2.8 mm Liver: No focal lesion identified. Within normal limits in parenchymal echogenicity. Portal vein is patent on color Doppler imaging with normal direction of blood flow towards the liver. Other: None. IMPRESSION: Cholelithiasis without secondary signs of acute cholecystitis. Electronically Signed   By: Bard Moats M.D.   On: 06/11/2023 13:53    Procedures Procedures    Medications Ordered in ED Medications  ondansetron  (ZOFRAN ) injection 4 mg (has no administration in time range)  oxyCODONE -acetaminophen  (PERCOCET/ROXICET) 5-325 MG per tablet 2 tablet (has no administration in time range)  ondansetron  (ZOFRAN -ODT) disintegrating tablet 8 mg (has no administration in time range)    ED Course/ Medical Decision Making/ A&P Clinical Course as of 06/11/23 1406  Wed Jun 11, 2023  1355 CBC reviewed interpreted and negative Complete metabolic panel significant for mild hyperglycemia glucose 121 otherwise no acute abnormalities Pregnancy is negative Urine is significant for 6-10 red blood cells and 21-50 white blood cells with rare bacteria and squamous epithelial cells noted doubt infection [DR]  1357 Ultrasound of abdomen significant for cholelithiasis without secondary signs of acute cholecystitis [DR]    Clinical Course User Index [DR] Levander Houston, MD                                 Medical Decision Making Amount and/or Complexity of Data Reviewed Labs: ordered. Radiology: ordered.  Risk Prescription drug management.   38 year old female presents  today complaining of upper abdominal pain.  She had 1 episode of vomiting. Patient with some right upper quadrant tenderness Evaluated here with labs and imaging Gallstones noted on imaging without evidence of acute cholecystitis Patient is taking p.o. pain medicine.  She has some decreased pain. She is been taking p.o. fluids at home and is not actively vomiting Patient appears stable for referral outpatient to surgery. We discussed return precautions and follow-up she voiced understanding        Final Clinical Impression(s) / ED Diagnoses Final diagnoses:  Calculus of gallbladder without cholecystitis without obstruction    Rx / DC Orders ED Discharge Orders          Ordered    oxyCODONE -acetaminophen  (PERCOCET/ROXICET) 5-325 MG tablet  Every 6  hours PRN        06/11/23 1405    ondansetron  (ZOFRAN -ODT) 8 MG disintegrating tablet  Every 8 hours PRN        06/11/23 1405              Levander Houston, MD 06/11/23 1406
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
## 2023-06-11 NOTE — Discharge Instructions (Addendum)
 Please use pain medicine and nausea medicine as needed. Drink plenty of clear liquids and avoid fatty foods Call [facility] for follow-up for gallstones Return if you are unable to control pain with your pain medicine or are worse or unable to tolerate liquids by mouth

## 2023-06-11 NOTE — ED Triage Notes (Signed)
 Patient reports pain across abdomen and low back pain with emesis onset yesterday , no diarrhea or fever .

## 2023-06-18 ENCOUNTER — Other Ambulatory Visit: Payer: Self-pay

## 2023-06-18 ENCOUNTER — Encounter (HOSPITAL_COMMUNITY): Payer: Self-pay | Admitting: Emergency Medicine

## 2023-06-18 ENCOUNTER — Emergency Department (HOSPITAL_COMMUNITY)
Admission: EM | Admit: 2023-06-18 | Discharge: 2023-06-19 | Disposition: A | Discharge status: Home / Self Care | Payer: Self-pay | Attending: Emergency Medicine | Admitting: Emergency Medicine

## 2023-06-18 DIAGNOSIS — R3915 Urgency of urination: Secondary | ICD-10-CM | POA: Insufficient documentation

## 2023-06-18 DIAGNOSIS — K5900 Constipation, unspecified: Secondary | ICD-10-CM | POA: Insufficient documentation

## 2023-06-18 LAB — CBC WITH DIFFERENTIAL/PLATELET
Abs Immature Granulocytes: 0.04 10*3/uL (ref 0.00–0.07)
Basophils Absolute: 0 10*3/uL (ref 0.0–0.1)
Basophils Relative: 0 %
Eosinophils Absolute: 0.1 10*3/uL (ref 0.0–0.5)
Eosinophils Relative: 1 %
HCT: 41.2 % (ref 36.0–46.0)
Hemoglobin: 13.4 g/dL (ref 12.0–15.0)
Immature Granulocytes: 0 %
Lymphocytes Relative: 28 %
Lymphs Abs: 2.7 10*3/uL (ref 0.7–4.0)
MCH: 28 pg (ref 26.0–34.0)
MCHC: 32.5 g/dL (ref 30.0–36.0)
MCV: 86.2 fL (ref 80.0–100.0)
Monocytes Absolute: 0.6 10*3/uL (ref 0.1–1.0)
Monocytes Relative: 6 %
Neutro Abs: 6.3 10*3/uL (ref 1.7–7.7)
Neutrophils Relative %: 65 %
Platelets: 277 10*3/uL (ref 150–400)
RBC: 4.78 MIL/uL (ref 3.87–5.11)
RDW: 12.6 % (ref 11.5–15.5)
WBC: 9.6 10*3/uL (ref 4.0–10.5)
nRBC: 0 % (ref 0.0–0.2)

## 2023-06-18 LAB — BASIC METABOLIC PANEL
Anion gap: 5 (ref 5–15)
BUN: 12 mg/dL (ref 6–20)
CO2: 27 mmol/L (ref 22–32)
Calcium: 9 mg/dL (ref 8.9–10.3)
Chloride: 102 mmol/L (ref 98–111)
Creatinine, Ser: 0.68 mg/dL (ref 0.44–1.00)
GFR, Estimated: >60 mL/min (ref >60)
Glucose, Bld: 148 mg/dL — ABNORMAL HIGH (ref 70–99)
Potassium: 3.5 mmol/L (ref 3.5–5.1)
Sodium: 134 mmol/L — ABNORMAL LOW (ref 135–145)

## 2023-06-18 NOTE — ED Triage Notes (Addendum)
 Pt came to ed because she feels like she can't use the bathroom. She feels she has to void but can only partially void.  Pt unable to tell when/what of most recent weight.  Tele Interpreter used and present.

## 2023-06-18 NOTE — ED Provider Triage Note (Signed)
Emergency Medicine Provider Triage Evaluation Note  Regina [name] , a 38 y.o. female  was evaluated in triage.  Pt complains of urinary urgency.  States that she feels that she needs to urinate but cannot.  She does have some urine that comes out at times.  She denies back pain or saddle anesthesia.  Review of Systems  Positive: Urinary frequency and urgency Negative: Flank pain  Physical Exam  BP (!) 136/100   Pulse 85   Temp 97.9 F (36.6 C)   Resp 16   SpO2 98%  Gen:   Awake, no distress   Resp:  Normal effort  MSK:   Moves extremities without difficulty  Other:    Medical Decision Making  Medically screening exam initiated at 6:43 PM.  Appropriate orders placed.  Regina [name] was informed that the remainder of the evaluation will be completed by another provider, this initial triage assessment does not replace that evaluation, and the importance of remaining in the ED until their evaluation is complete.     Arthor Captain, PA-C 06/18/23 1844

## 2023-06-19 ENCOUNTER — Emergency Department (HOSPITAL_COMMUNITY): Payer: Self-pay

## 2023-06-19 LAB — URINALYSIS, ROUTINE W REFLEX MICROSCOPIC
Bilirubin Urine: NEGATIVE
Glucose, UA: NEGATIVE mg/dL
Hgb urine dipstick: NEGATIVE
Ketones, ur: NEGATIVE mg/dL
Nitrite: NEGATIVE
Protein, ur: NEGATIVE mg/dL
Specific Gravity, Urine: 1.012 (ref 1.005–1.030)
pH: 5 (ref 5.0–8.0)

## 2023-06-19 LAB — HEPATIC FUNCTION PANEL
ALT: 19 U/L (ref 0–44)
AST: 18 U/L (ref 15–41)
Albumin: 3.6 g/dL (ref 3.5–5.0)
Alkaline Phosphatase: 67 U/L (ref 38–126)
Bilirubin, Direct: <0.1 mg/dL (ref 0.0–0.2)
Total Bilirubin: 0.4 mg/dL (ref 0.0–1.2)
Total Protein: 7.4 g/dL (ref 6.5–8.1)

## 2023-06-19 LAB — PREGNANCY, URINE: Preg Test, Ur: NEGATIVE

## 2023-06-19 MED ORDER — SODIUM CHLORIDE 0.9 % IV BOLUS
1000.0000 mL | Freq: Once | INTRAVENOUS | Status: AC
  Administered 2023-06-19: 1000 mL via INTRAVENOUS

## 2023-06-19 MED ORDER — IOHEXOL 350 MG/ML SOLN
75.0000 mL | Freq: Once | INTRAVENOUS | Status: AC | PRN
  Administered 2023-06-19: 75 mL via INTRAVENOUS

## 2023-06-19 MED ORDER — FLEET ENEMA RE ENEM
1.0000 | ENEMA | Freq: Once | RECTAL | Status: AC
  Administered 2023-06-19: 1 via RECTAL
  Filled 2023-06-19: qty 1

## 2023-06-19 NOTE — ED Provider Notes (Signed)
[place] [facility] AT [facility] Provider Note   CSN: [id number] Arrival date & time: 06/18/23  1627    History  Chief Complaint  Patient presents with   unable to void    Regina [name] is a 38 y.o. female no significant past medical history here for evaluation of difficulty urinating.  States she feels like an ongoing void small amounts.  Has some burning with urination. Some decreased PO intake however without N/V. No fever, flank pain. No hx of similar. No new medications. Denies chance of pregnancy. No pelvic pain, dc. No recent falls or injuries. No back pain. Last Bm 2 days ago.  HPI     Home Medications Prior to Admission medications   Medication Sig Start Date End Date Taking? Authorizing Provider  acetaminophen (TYLENOL) 325 MG tablet Take 2 tablets (650 mg total) by mouth every 4 (four) hours as needed (for pain scale < 4). 02/05/21   Leftwich-Kirby, Wilmer Floor, CNM  doxycycline (VIBRAMYCIN) 100 MG capsule Take 1 capsule (100 mg total) by mouth 2 (two) times daily. 09/26/22   Gwyneth Sprout, MD  ibuprofen (ADVIL) 600 MG tablet Take 1 tablet (600 mg total) by mouth every 6 (six) hours. 02/05/21   Leftwich-Kirby, Wilmer Floor, CNM  ondansetron (ZOFRAN-ODT) 8 MG disintegrating tablet Take 1 tablet (8 mg total) by mouth every 8 (eight) hours as needed for nausea or vomiting. 06/11/23   Margarita Grizzle, MD  oxyCODONE-acetaminophen (PERCOCET/ROXICET) 5-325 MG tablet Take 1 tablet by mouth every 6 (six) hours as needed for severe pain (pain score 7-10). 06/11/23   Margarita Grizzle, MD  Prenatal Vit-Fe Fumarate-FA (PRENATAL VITAMIN PO) Take by mouth.    [provider]      Allergies    Patient has no known allergies.    Review of Systems   Review of Systems  Constitutional: Negative.   HENT: Negative.    Respiratory: Negative.    Cardiovascular: Negative.   Gastrointestinal: Negative.   Genitourinary:  Positive for decreased urine volume,  difficulty urinating and dysuria. Negative for dyspareunia, enuresis, flank pain, frequency, genital sores, hematuria, menstrual problem, pelvic pain, urgency, vaginal bleeding, vaginal discharge and vaginal pain.  Musculoskeletal: Negative.   Skin: Negative.   Neurological: Negative.   All other systems reviewed and are negative.   Physical Exam Updated Vital Signs BP 115/75 (BP Location: Right Arm)   Pulse 81   Temp 98.4 F (36.9 C)   Resp 18   Ht 5\' 2"  (1.575 m)   SpO2 99%   BMI 42.98 kg/m  Physical Exam Vitals and nursing note reviewed.  Constitutional:      General: She is not in acute distress.    Appearance: She is well-developed. She is not ill-appearing, toxic-appearing or diaphoretic.  HENT:     Head: Atraumatic.  Eyes:     Pupils: Pupils are equal, round, and reactive to light.  Cardiovascular:     Rate and Rhythm: Normal rate.     Pulses: Normal pulses.     Heart sounds: Normal heart sounds.  Pulmonary:     Effort: Pulmonary effort is normal. No respiratory distress.     Breath sounds: Normal breath sounds.  Abdominal:     General: Bowel sounds are normal. There is no distension.     Palpations: Abdomen is soft.     Tenderness: There is no abdominal tenderness. There is no right CVA tenderness, left CVA tenderness, guarding or rebound.  Musculoskeletal:  General: No swelling, tenderness, deformity or signs of injury. Normal range of motion.     Cervical back: Normal range of motion.     Right lower leg: No edema.     Left lower leg: No edema.  Skin:    General: Skin is warm and dry.     Capillary Refill: Capillary refill takes less than 2 seconds.  Neurological:     General: No focal deficit present.     Mental Status: She is alert.  Psychiatric:        Mood and Affect: Mood normal.    ED Results / Procedures / Treatments   Labs (all labs ordered are listed, but only abnormal results are displayed) Labs Reviewed  BASIC METABOLIC PANEL -  Abnormal; Notable for the following components:      Result Value   Sodium 134 (*)    Glucose, Bld 148 (*)    All other components within normal limits  CBC WITH DIFFERENTIAL/PLATELET  URINALYSIS, ROUTINE W REFLEX MICROSCOPIC  PREGNANCY, URINE    EKG None  Radiology No results found.  Procedures Procedures    Medications Ordered in ED Medications  sodium chloride 0.9 % bolus 1,000 mL (1,000 mLs Intravenous New Bag/Given 06/19/23 0554)   ED Course/ Medical Decision Making/ A&P    38 year old here for evaluation of urinary urgency, feels like she is not able to fully be her bladder.  Has lower abdominal pain with this as well.  No fever, flank pain, back pain, pelvic pain, vag dc.  Heart and lungs clear.  Abdomen soft, no rebound or guarding.  Mild suprapubic tenderness.  Negative CVA tap. Plan on labs and reassessment.  Labs personally viewed and interpreted:  CBC without leukocytosis  Metabolic panel sodium 134  Patient getting IVF. Plan on UA, preg test. After IVF will do trial void and bladder scan for post void residual.  Care transferred to [place], [place] who will FU on remaining labs, bladder scan and disposition                                  Medical Decision Making Amount and/or Complexity of Data Reviewed Independent Historian:     Details: Daughter in room External Data Reviewed: labs and notes. Labs: ordered. Decision-making details documented in ED Course.  Risk Prescription drug management. Decision regarding hospitalization. Diagnosis or treatment significantly limited by social determinants of health.          Final Clinical Impression(s) / ED Diagnoses Final diagnoses:  None    Rx / DC Orders ED Discharge Orders     None         Vernee Baines A, PA-C 06/19/23 0601    Sabas Sous, MD 06/19/23 [phone]

## 2023-06-19 NOTE — ED Provider Notes (Signed)
  Physical Exam  BP 109/76   Pulse 71   Temp 98.1 F (36.7 C) (Oral)   Resp 18   Ht 5\' 2"  (1.575 m)   SpO2 100%   BMI 42.98 kg/m   Physical Exam  Procedures  Procedures  ED Course / MDM    Medical Decision Making Amount and/or Complexity of Data Reviewed Labs: ordered. Radiology: ordered.  Risk OTC drugs. Prescription drug management.   Retention - IV fluids Failed post-void residucal CT - constipation Waiting for enema Plan: trial foley out - discharge?  Patient given enema at 16:00  17:00 - introduction and recheck She is on the bedside commode after enema, moving small amount of stool that appears soft. On rectal exam, there is soft, nonbloody brown stool at fingertip.   With interpreter, discussed continuing to try to provide relief.  Continue to observe.   18:00 - foley removed for trial as planned. She reports having a large bowel motion and feels better from that standpoint.   19:00 - No urination. Appears more comfortable. IV fluids, PO fluids provided.   20:45 - patient has urinated. She also had another large bowel movement. VSS. She is felt appropriate for discharge home.          Elpidio Anis, PA-C 06/19/23 2051    Tegeler, Canary Brim, MD 06/20/23 [phone]

## 2023-06-19 NOTE — ED Provider Notes (Signed)
Patient's care assumed at 6:30 AM.  Patient has UA pending.  Patient came to the emergency department because she is unable to urinate.  Plan is that patient is receiving IV fluids.  She is to void after IV fluids and have a bladder scan postvoid.  Patient to be treated based on findings.  Patient received fluids.  Patient voided.  Patient's postvoid bladder scan was over 800 cc.  Patient had a Foley placed and had 1400 cc of urine output.  Patient reports some relief but still complains of abdominal pain.  CT abdomen obtained.  CT does show moderate stool.  I am concerned that patient had a bladder outlet obstruction secondary to constipation.  I asked nursing to perform a enema.    Patient's care turned over to [facility].      Elson Areas, [place] 06/19/23 1513    Tegeler, Canary Brim, MD 06/19/23 [phone]

## 2023-06-19 NOTE — ED Notes (Signed)
  Patient had large BM and void in [facility].  [name] PA notified.

## 2023-06-19 NOTE — Discharge Instructions (Addendum)
Recommend Miralax daily until you are having regular soft bowel movements. Be sure to drink lots of water and noncaffeinated drinks to keep up urination and avoid more constipation. Please see you doctor if symptoms persist. Return to the emergency department if you have more difficulty with normal urination.   If you do not have a primary care provider, you can call [facility] for an appointment.   Se recomienda tomar Miralax diariamente hasta que tenga evacuaciones intestinales blandas y regulares. Asegrese de beber mucha agua y bebidas sin cafena para mantener la miccin y evitar ms estreimiento. Consulte a su mdico si los sntomas persisten. Regrese al departamento de emergencias si tiene ms dificultad para orinar con normalidad.  Si no tiene un proveedor de Marine scientist, Scientist, clinical (histocompatibility and immunogenetics) a [employer] para solicitar una cita.

## 2023-08-03 IMAGING — CR DG ABDOMEN 1V
2 series · 2 of 2 positions shown · non-contrast
Comparison: None.

CLINICAL DATA: Abdominal pain

EXAM:
ABDOMEN - 1 VIEW

[abdomen kub (1 of 2)]
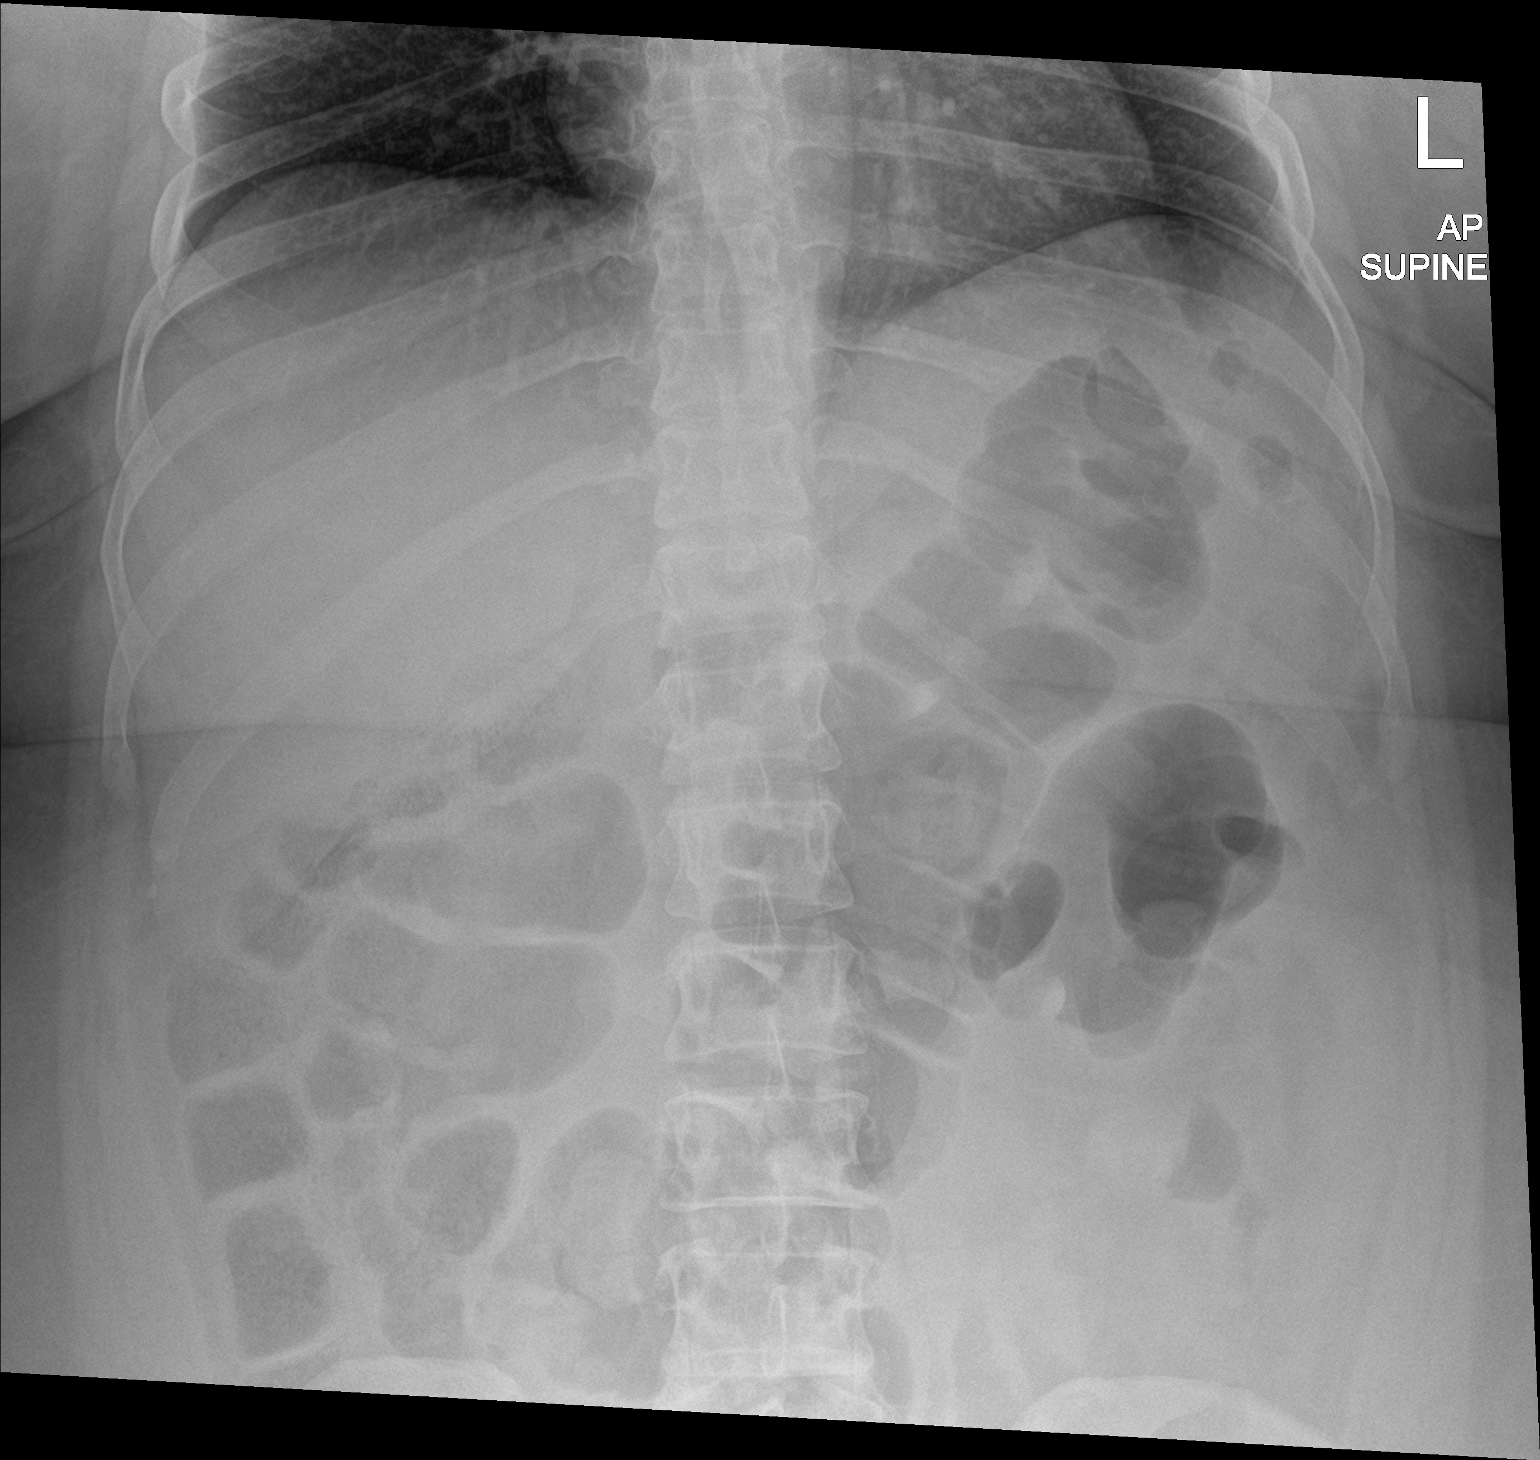

[abdomen kub (2 of 2)]
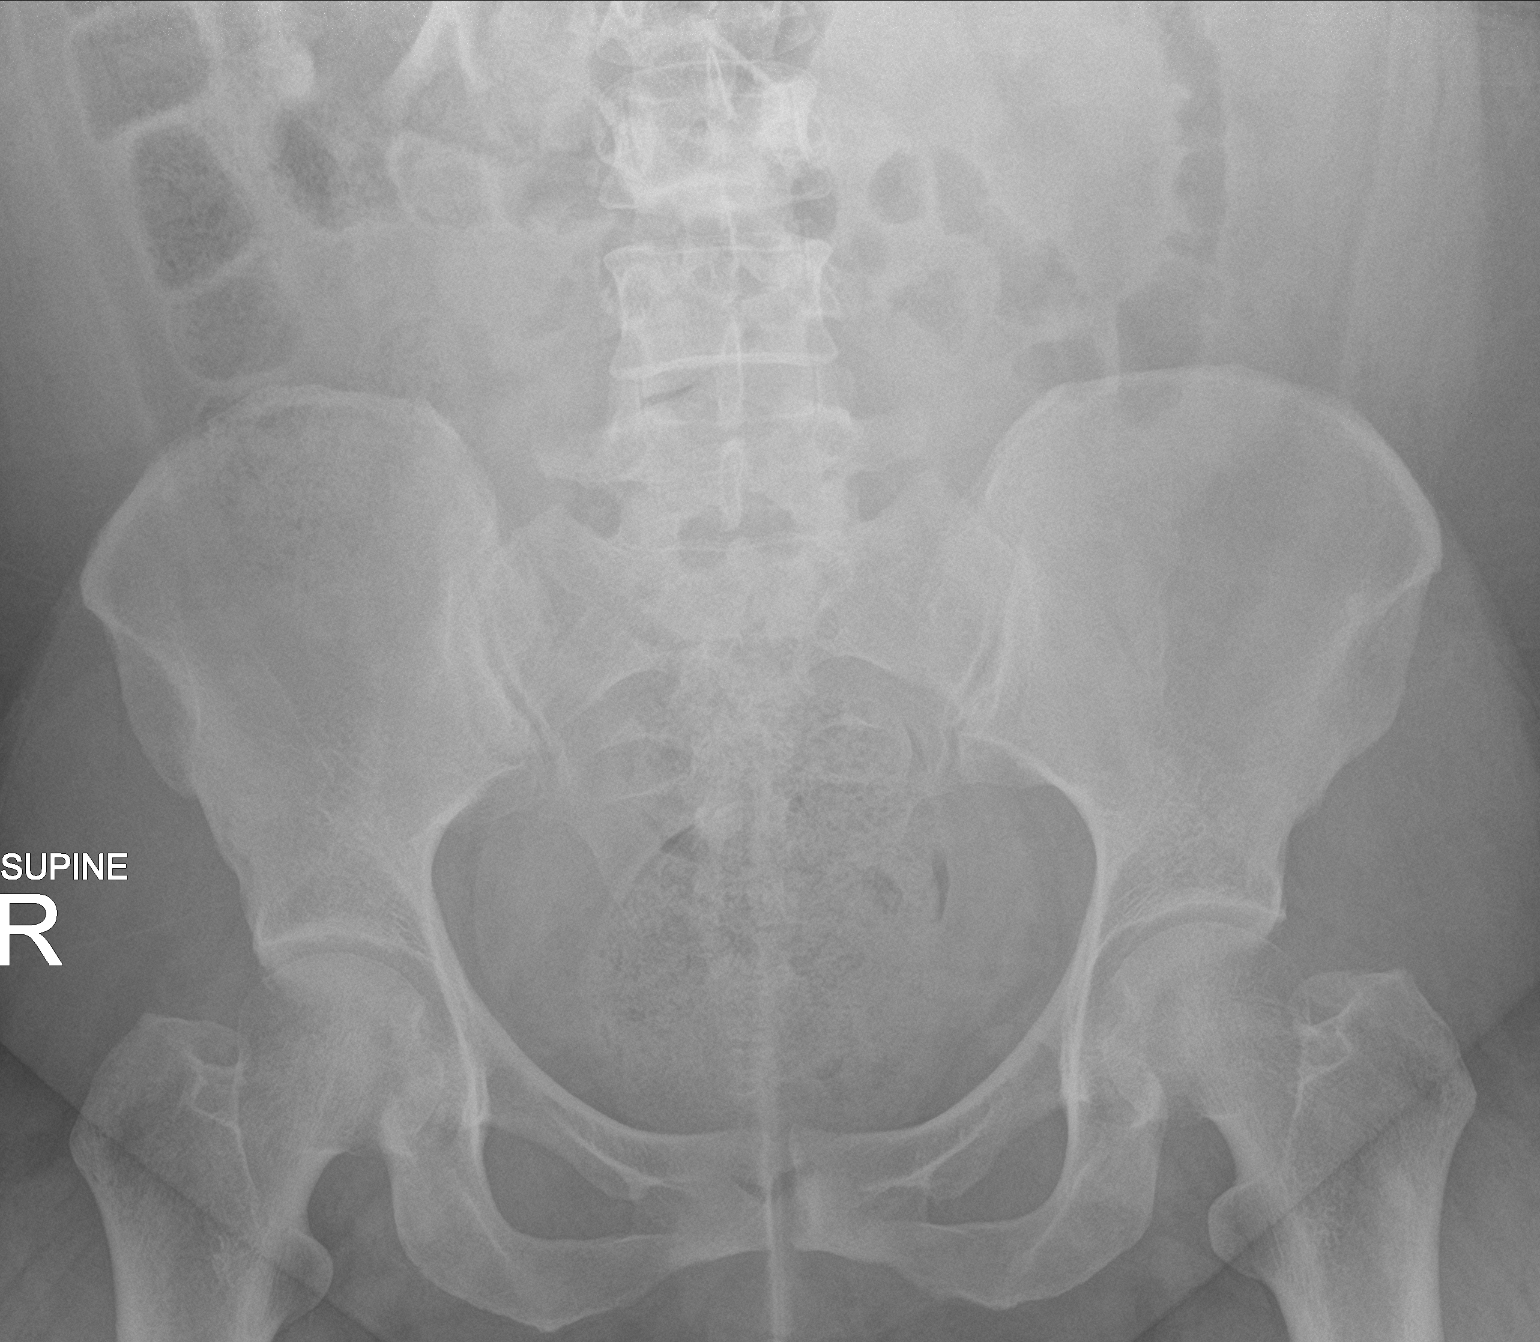

[2 of 2 positions shown; findings below may reference images not displayed]

FINDINGS: Scattered large and small bowel gas is noted. Mild retained fecal
material is noted within the rectum and right colon consistent with
a degree of constipation. No free air is seen. No bony abnormality
is noted.
IMPRESSION: Changes of mild constipation.

## 2024-02-09 ENCOUNTER — Other Ambulatory Visit: Payer: Self-pay

## 2024-02-09 ENCOUNTER — Emergency Department (HOSPITAL_COMMUNITY): Payer: Self-pay

## 2024-02-09 ENCOUNTER — Encounter (HOSPITAL_COMMUNITY): Payer: Self-pay | Admitting: *Deleted

## 2024-02-09 ENCOUNTER — Emergency Department (HOSPITAL_COMMUNITY)
Admission: EM | Admit: 2024-02-09 | Discharge: 2024-02-09 | Disposition: A | Discharge status: Home / Self Care | Payer: Self-pay | Attending: Emergency Medicine | Admitting: Emergency Medicine

## 2024-02-09 DIAGNOSIS — K8050 Calculus of bile duct without cholangitis or cholecystitis without obstruction: Secondary | ICD-10-CM | POA: Insufficient documentation

## 2024-02-09 LAB — COMPREHENSIVE METABOLIC PANEL WITH GFR
ALT: 14 U/L (ref 0–44)
AST: 14 U/L — ABNORMAL LOW (ref 15–41)
Albumin: 3.2 g/dL — ABNORMAL LOW (ref 3.5–5.0)
Alkaline Phosphatase: 63 U/L (ref 38–126)
Anion gap: 10 (ref 5–15)
BUN: 12 mg/dL (ref 6–20)
CO2: 26 mmol/L (ref 22–32)
Calcium: 8.7 mg/dL — ABNORMAL LOW (ref 8.9–10.3)
Chloride: 100 mmol/L (ref 98–111)
Creatinine, Ser: 0.77 mg/dL (ref 0.44–1.00)
GFR, Estimated: >60 mL/min (ref >60)
Glucose, Bld: 129 mg/dL — ABNORMAL HIGH (ref 70–99)
Potassium: 3.4 mmol/L — ABNORMAL LOW (ref 3.5–5.1)
Sodium: 136 mmol/L (ref 135–145)
Total Bilirubin: 0.4 mg/dL (ref 0.0–1.2)
Total Protein: 6.3 g/dL — ABNORMAL LOW (ref 6.5–8.1)

## 2024-02-09 LAB — URINALYSIS, ROUTINE W REFLEX MICROSCOPIC
Bilirubin Urine: NEGATIVE
Glucose, UA: NEGATIVE mg/dL
Ketones, ur: NEGATIVE mg/dL
Nitrite: NEGATIVE
Protein, ur: NEGATIVE mg/dL
Specific Gravity, Urine: 1.023 (ref 1.005–1.030)
pH: 6 (ref 5.0–8.0)

## 2024-02-09 LAB — I-STAT CHEM 8, ED
BUN: 13 mg/dL (ref 6–20)
Calcium, Ion: 1.15 mmol/L (ref 1.15–1.40)
Chloride: 101 mmol/L (ref 98–111)
Creatinine, Ser: 0.8 mg/dL (ref 0.44–1.00)
Glucose, Bld: 129 mg/dL — ABNORMAL HIGH (ref 70–99)
HCT: 38 % (ref 36.0–46.0)
Hemoglobin: 12.9 g/dL (ref 12.0–15.0)
Potassium: 3.5 mmol/L (ref 3.5–5.1)
Sodium: 139 mmol/L (ref 135–145)
TCO2: 25 mmol/L (ref 22–32)

## 2024-02-09 LAB — CBC
HCT: 38.7 % (ref 36.0–46.0)
Hemoglobin: 12.6 g/dL (ref 12.0–15.0)
MCH: 28.3 pg (ref 26.0–34.0)
MCHC: 32.6 g/dL (ref 30.0–36.0)
MCV: 87 fL (ref 80.0–100.0)
Platelets: 235 K/uL (ref 150–400)
RBC: 4.45 MIL/uL (ref 3.87–5.11)
RDW: 12.8 % (ref 11.5–15.5)
WBC: 8.4 K/uL (ref 4.0–10.5)
nRBC: 0 % (ref 0.0–0.2)

## 2024-02-09 LAB — HCG, SERUM, QUALITATIVE: Preg, Serum: NEGATIVE

## 2024-02-09 LAB — LIPASE, BLOOD: Lipase: 15 U/L (ref 11–51)

## 2024-02-09 MED ORDER — DOCUSATE SODIUM 100 MG PO CAPS
100.0000 mg | ORAL_CAPSULE | Freq: Two times a day (BID) | ORAL | 0 refills | Status: AC
Start: 2024-02-09 — End: ?

## 2024-02-09 MED ORDER — OXYCODONE-ACETAMINOPHEN 5-325 MG PO TABS
1.0000 | ORAL_TABLET | Freq: Four times a day (QID) | ORAL | 0 refills | Status: AC | PRN
Start: 2024-02-09 — End: ?

## 2024-02-09 MED ORDER — ONDANSETRON 4 MG PO TBDP: 4.0000 mg | ORAL_TABLET | Freq: Three times a day (TID) | ORAL | 0 refills | Status: AC | PRN

## 2024-02-09 MED ORDER — ONDANSETRON 4 MG PO TBDP
8.0000 mg | ORAL_TABLET | Freq: Once | ORAL | Status: AC
  Administered 2024-02-09: 8 mg via ORAL
  Filled 2024-02-09: qty 2

## 2024-02-09 MED ORDER — OXYCODONE HCL 5 MG PO TABS
10.0000 mg | ORAL_TABLET | Freq: Once | ORAL | Status: AC
  Administered 2024-02-09: 10 mg via ORAL
  Filled 2024-02-09: qty 2

## 2024-02-09 NOTE — ED Notes (Signed)
 Patient returned from ultrasound.

## 2024-02-09 NOTE — ED Triage Notes (Signed)
 Pt c/o abd pain since yesterday with n and v  lmp unknown

## 2024-02-09 NOTE — ED Notes (Signed)
 Patient transported to Ultrasound

## 2024-02-10 NOTE — ED Provider Notes (Signed)
 Emergency Department Provider Note  TRIAGE NOTE: Pt c/o abd pain since yesterday with n and v  lmp unknown  HISTORY  Chief Complaint Abdominal Pain   HPI Regina [name] is a 38 y.o. female with  right-sided upper abdominal pain, which began at approximately 1:30 AM. The pain is persistent and present at the time of evaluation. The patient reports experiencing a little vomiting but denies any fever. There is no history of similar episodes, and the patient has not undergone any previous surgeries. The patient denies any lower back pain. History was obtained from the patient.  PMH Past Medical History:  Diagnosis Date   Gestational Diabetes    Medical history non-contributory     Home Medications Prior to Admission medications   Medication Sig Start Date End Date Taking? Authorizing Provider  docusate sodium  (COLACE) 100 MG capsule Take 1 capsule (100 mg total) by mouth every 12 (twelve) hours. 02/09/24  Yes Paytience Bures, Selinda, MD  ondansetron  (ZOFRAN -ODT) 4 MG disintegrating tablet Take 1 tablet (4 mg total) by mouth every 8 (eight) hours as needed for vomiting. 02/09/24  Yes Yekaterina Escutia, Selinda, MD  oxyCODONE -acetaminophen  (PERCOCET/ROXICET) 5-325 MG tablet Take 1 tablet by mouth every 6 (six) hours as needed for severe pain (pain score 7-10). 02/09/24   Jersie Beel, Selinda, MD    Social History Social History   Tobacco Use   Smoking status: Never   Smokeless tobacco: Never  Vaping Use   Vaping status: Never Used  Substance Use Topics   Alcohol use: No   Drug use: No    Review of Systems: Documented in HPI ____________________________________________  PHYSICAL EXAM: VITAL SIGNS: Triage: Blood pressure 107/68, pulse 82, temperature 98.3 F (36.8 C), resp. rate 19, height 5' 2 (1.575 m), weight 106.6 kg, SpO2 97%, unknown if currently breastfeeding.  Vitals:   02/09/24 0233 02/09/24 0236 02/09/24 0615  BP: (!) 129/90  107/68  Pulse: 84  82  Resp: 19  19  Temp: 98.3 F  (36.8 C)    SpO2: 97%  97%  Weight:  106.6 kg   Height:  5' 2 (1.575 m)     Physical Exam Vitals and nursing note reviewed.  Constitutional:      Appearance: She is well-developed.  HENT:     Head: Normocephalic and atraumatic.  Cardiovascular:     Rate and Rhythm: Normal rate and regular rhythm.  Pulmonary:     Effort: No respiratory distress.     Breath sounds: No stridor.  Abdominal:     General: There is no distension.     Tenderness: There is abdominal tenderness in the right upper quadrant.  Musculoskeletal:     Cervical back: Normal range of motion.  Neurological:     Mental Status: She is alert.       ____________________________________________   LABS (all labs ordered are listed, but only abnormal results are displayed)  Labs Reviewed  COMPREHENSIVE METABOLIC PANEL WITH GFR - Abnormal; Notable for the following components:      Result Value   Potassium 3.4 (*)    Glucose, Bld 129 (*)    Calcium 8.7 (*)    Total Protein 6.3 (*)    Albumin 3.2 (*)    AST 14 (*)    All other components within normal limits  URINALYSIS, ROUTINE W REFLEX MICROSCOPIC - Abnormal; Notable for the following components:   APPearance CLOUDY (*)    Hgb urine dipstick SMALL (*)    Leukocytes,Ua MODERATE (*)  Bacteria, UA FEW (*)    All other components within normal limits  I-STAT CHEM 8, ED - Abnormal; Notable for the following components:   Glucose, Bld 129 (*)    All other components within normal limits  LIPASE, BLOOD  CBC  HCG, SERUM, QUALITATIVE   ____________________________________________  EKG   EKG Interpretation Date/Time:    Ventricular Rate:    PR Interval:    QRS Duration:    QT Interval:    QTC Calculation:   R Axis:      Text Interpretation:          ____________________________________________  RADIOLOGY  US  Abdomen Limited RUQ (LIVER/GB) Result Date: 02/09/2024 EXAM: Right Upper Quadrant Abdominal Ultrasound TECHNIQUE: Real-time  ultrasonography of the right upper quadrant of the abdomen was performed. COMPARISON: 06/11/23 CLINICAL HISTORY: RUQ pain FINDINGS: LIVER: Increased parenchymal echogenicity of the liver. No intrahepatic biliary ductal dilatation. No mass. The portal vein is visualized, appears patent with flow towards the liver. BILIARY SYSTEM: Multiple stones and layering sludge identified within the gallbladder. The largest stone is in the gallbladder neck measuring 2.3 cm. The gallbladder wall thickness measures 3.1 mm. No pericholecystic fluid. Negative sonographic Murphy's sign. Common bile duct is within normal limits measuring 3.1 mm. OTHER: No right upper quadrant ascites. IMPRESSION: 1. Gallstones and gallbladder sludge. Gallbladder wall is mildly thickened, measuring 3.1 mm. No pericholecystic fluid or sonographic Murphy sign. Findings equivocal for acute cholecystitis. If further imaging is clinically indicated, consider nuclear medicine hepatobiliary scan. 2. Increased parenchymal echogenicity of the liver suggestive of hepatic steatosis. Electronically signed by: Waddell Calk MD 02/09/2024 04:54 AM EDT RP Workstation: [id number]   ____________________________________________  PROCEDURES  Procedure(s) performed:   Procedures ____________________________________________  INITIAL IMPRESSION / ASSESSMENT AND PLAN     Initial DDx:        ED Course   Patient with right upper quadrant pain associate with vomiting starting around 130 this morning.  No similar episodes in the past.  Labs reassuring however concern for possible cholecystitis and ultrasound was performed which showed cholelithiasis and borderline wall thickening but no fluid or other evidence of cholecystitis.  On reevaluation patient is pain-free.  Suspect is likely biliary colic related to the cholelithiasis.  Advised close follow-up with surgery for consideration of elective cholecystectomy.  Also discussed reasons to return to emergency  room for persistent, worsening symptoms.      Images ordered viewed and obtained by myself. Agree with Radiology interpretation. Details in ED course.  Labs ordered reviewed by myself as detailed in ED course.  Consultations obtained/considered detailed in ED course.    FINAL IMPRESSION Final diagnoses:  Biliary colic     Disposition A medical screening exam was performed and I feel the patient has had an appropriate workup for their chief complaint at this time and likelihood of emergent condition existing is low. They have been counseled on decision, DISCHARGE, follow up and which symptoms necessitate immediate return to the emergency department. They or their family verbally stated understanding and agreement with plan and discharged in stable condition.   ____________________________________________   NEW OUTPATIENT MEDICATIONS STARTED DURING THIS VISIT:  Discharge Medication List as of 02/09/2024  6:32 AM     START taking these medications   Details  docusate sodium  (COLACE) 100 MG capsule Take 1 capsule (100 mg total) by mouth every 12 (twelve) hours., Starting Mon 02/09/2024, Normal        Note:  This note was prepared with assistance of Dragon voice  recognition software. Occasional wrong-word or sound-a-like substitutions may have occurred due to the inherent limitations of voice recognition software.    Itzel Mckibbin, Selinda, MD 02/10/24 [phone]

## 2024-04-18 ENCOUNTER — Encounter (HOSPITAL_COMMUNITY): Payer: Self-pay

## 2024-04-18 ENCOUNTER — Emergency Department (HOSPITAL_COMMUNITY)
Admission: EM | Admit: 2024-04-18 | Discharge: 2024-04-19 | Payer: Self-pay | Attending: Emergency Medicine | Admitting: Emergency Medicine

## 2024-04-18 ENCOUNTER — Other Ambulatory Visit: Payer: Self-pay

## 2024-04-18 DIAGNOSIS — R1011 Right upper quadrant pain: Secondary | ICD-10-CM | POA: Insufficient documentation

## 2024-04-18 DIAGNOSIS — Z5321 Procedure and treatment not carried out due to patient leaving prior to being seen by health care provider: Secondary | ICD-10-CM | POA: Insufficient documentation

## 2024-04-18 NOTE — ED Triage Notes (Signed)
 Patient w/ upper R sided abdominal pain x 30 minutes. Denies n/v/d. Denies dysuria.

## 2024-04-18 NOTE — ED Notes (Signed)
 Pt unable to void at this time.  KM

## 2024-04-19 LAB — COMPREHENSIVE METABOLIC PANEL WITH GFR
ALT: 13 U/L (ref 0–44)
AST: 14 U/L — ABNORMAL LOW (ref 15–41)
Albumin: 3.3 g/dL — ABNORMAL LOW (ref 3.5–5.0)
Alkaline Phosphatase: 61 U/L (ref 38–126)
Anion gap: 8 (ref 5–15)
BUN: 18 mg/dL (ref 6–20)
CO2: 25 mmol/L (ref 22–32)
Calcium: 8.7 mg/dL — ABNORMAL LOW (ref 8.9–10.3)
Chloride: 102 mmol/L (ref 98–111)
Creatinine, Ser: 1.04 mg/dL — ABNORMAL HIGH (ref 0.44–1.00)
GFR, Estimated: >60 mL/min (ref >60)
Glucose, Bld: 115 mg/dL — ABNORMAL HIGH (ref 70–99)
Potassium: 3.8 mmol/L (ref 3.5–5.1)
Sodium: 135 mmol/L (ref 135–145)
Total Bilirubin: 0.4 mg/dL (ref 0.0–1.2)
Total Protein: 6.9 g/dL (ref 6.5–8.1)

## 2024-04-19 LAB — CBC
HCT: 39.6 % (ref 36.0–46.0)
Hemoglobin: 12.8 g/dL (ref 12.0–15.0)
MCH: 28.1 pg (ref 26.0–34.0)
MCHC: 32.3 g/dL (ref 30.0–36.0)
MCV: 86.8 fL (ref 80.0–100.0)
Platelets: 245 K/uL (ref 150–400)
RBC: 4.56 MIL/uL (ref 3.87–5.11)
RDW: 13 % (ref 11.5–15.5)
WBC: 8.2 K/uL (ref 4.0–10.5)
nRBC: 0 % (ref 0.0–0.2)

## 2024-04-19 LAB — URINALYSIS, ROUTINE W REFLEX MICROSCOPIC
Bilirubin Urine: NEGATIVE
Glucose, UA: NEGATIVE mg/dL
Ketones, ur: NEGATIVE mg/dL
Leukocytes,Ua: NEGATIVE
Nitrite: NEGATIVE
Protein, ur: NEGATIVE mg/dL
Specific Gravity, Urine: >1.03 — ABNORMAL HIGH (ref 1.005–1.030)
pH: 5.5 (ref 5.0–8.0)

## 2024-04-19 LAB — URINALYSIS, MICROSCOPIC (REFLEX): Bacteria, UA: NONE SEEN

## 2024-04-19 LAB — LIPASE, BLOOD: Lipase: 24 U/L (ref 11–51)

## 2024-04-19 LAB — HCG, SERUM, QUALITATIVE: Preg, Serum: NEGATIVE

## 2024-04-19 NOTE — ED Notes (Signed)
 Called pt x3 to update v/s, no answer.  KM
